# Patient Record
Sex: Male | Born: 1995 | Race: White | Hispanic: No | Marital: Married | Smoking: Former smoker
Health system: Southern US, Community
[De-identification: ages and names within clinical notes are randomized; demographics above are authoritative.]

## PROBLEM LIST (undated history)

## (undated) DIAGNOSIS — T7840XA Allergy, unspecified, initial encounter: Secondary | ICD-10-CM

## (undated) HISTORY — DX: Allergy, unspecified, initial encounter: T78.40XA

---

## 2013-02-14 ENCOUNTER — Emergency Department: Payer: Self-pay | Admitting: Unknown Physician Specialty

## 2017-07-05 ENCOUNTER — Encounter: Payer: Self-pay | Admitting: Primary Care

## 2017-07-05 ENCOUNTER — Ambulatory Visit (INDEPENDENT_AMBULATORY_CARE_PROVIDER_SITE_OTHER): Payer: BLUE CROSS/BLUE SHIELD | Admitting: Primary Care

## 2017-07-05 ENCOUNTER — Encounter (INDEPENDENT_AMBULATORY_CARE_PROVIDER_SITE_OTHER): Payer: Self-pay

## 2017-07-05 VITALS — BP 148/94 | HR 88 | Temp 97.8°F | Ht 68.75 in | Wt 227.1 lb

## 2017-07-05 DIAGNOSIS — E785 Hyperlipidemia, unspecified: Secondary | ICD-10-CM | POA: Diagnosis not present

## 2017-07-05 DIAGNOSIS — L918 Other hypertrophic disorders of the skin: Secondary | ICD-10-CM | POA: Diagnosis not present

## 2017-07-05 DIAGNOSIS — R030 Elevated blood-pressure reading, without diagnosis of hypertension: Secondary | ICD-10-CM | POA: Diagnosis not present

## 2017-07-05 LAB — COMPREHENSIVE METABOLIC PANEL
ALBUMIN: 5 g/dL (ref 3.5–5.2)
ALK PHOS: 57 U/L (ref 39–117)
ALT: 134 U/L — ABNORMAL HIGH (ref 0–53)
AST: 50 U/L — ABNORMAL HIGH (ref 0–37)
BUN: 13 mg/dL (ref 6–23)
CALCIUM: 10.3 mg/dL (ref 8.4–10.5)
CHLORIDE: 100 meq/L (ref 96–112)
CO2: 26 mEq/L (ref 19–32)
Creatinine, Ser: 0.94 mg/dL (ref 0.40–1.50)
GFR: 105.11 mL/min (ref >60.00)
Glucose, Bld: 80 mg/dL (ref 70–99)
POTASSIUM: 4.2 meq/L (ref 3.5–5.1)
Sodium: 137 mEq/L (ref 135–145)
TOTAL PROTEIN: 8 g/dL (ref 6.0–8.3)
Total Bilirubin: 0.4 mg/dL (ref 0.2–1.2)

## 2017-07-05 LAB — LIPID PANEL
CHOL/HDL RATIO: 5
CHOLESTEROL: 230 mg/dL — AB (ref 0–200)
HDL: 50.4 mg/dL (ref >39.00)
Triglycerides: 473 mg/dL — ABNORMAL HIGH (ref 0.0–149.0)

## 2017-07-05 LAB — HEMOGLOBIN A1C: HEMOGLOBIN A1C: 5.1 % (ref 4.6–6.5)

## 2017-07-05 LAB — LDL CHOLESTEROL, DIRECT: Direct LDL: 135 mg/dL

## 2017-07-05 NOTE — Assessment & Plan Note (Signed)
Located to right groin. Doesn't appear suspicious. Discussed that this can be removed in the office on another day. He will schedule.

## 2017-07-05 NOTE — Progress Notes (Signed)
Subjective:    Patient ID: Samuel Fields, male    DOB: Mar 23, 1996, 21 y.o.   MRN: [id number]  HPI  Samuel Fields is a 21 year old male who presents today to establish care and discuss the problems mentioned below. Will obtain old records.  1) Elevated Blood Pressure Reading: BP of 148/94 in the office today. Prior history of tobacco abuse, currently using e-cigarettes. He had a health screening at work one year ago and was told that he has hyperlipidemia and high blood pressure. Strong family history of heart attack in his father at age 23, who has had 2 additional heart attacks since then. Strong family history of hypertension in mother and father, diabetes in father. He does not check his blood pressure at home. He denies chest pain, dizziness, headaches, visual changes.   Diet currently consists of:  Breakfast: Skips, sometimes fast food or cereal Lunch: Sandwich, sometimes chips Dinner: [employer] food, grilled meat, steak, corn potatoes Snacks: Chips, crackers, hot pocket, pizza rolls. Desserts: Occasionally  Beverages: Soda, little water  Exercise: He does not exercise.    2) Nevus: Located to the right groin that has been present for the last several years. Over the last 2 months he's noticed intermittent swelling, irritation, and whitish drainage.   Review of Systems  Eyes: Negative for visual disturbance.  Respiratory: Negative for shortness of breath.   Cardiovascular: Negative for chest pain.  Skin:       Nevus   Neurological: Negative for dizziness and headaches.       Past Medical History:  Diagnosis Date  . Allergy      Social History   Social History  . Marital status: Single    Spouse name: N/A  . Number of children: N/A  . Years of education: N/A   Occupational History  . Not on file.   Social History Main Topics  . Smoking status: Former Games developermoker  . Smokeless tobacco: Former NeurosurgeonUser  . Alcohol use Yes  . Drug use: Unknown  . Sexual activity: Not on file    Other Topics Concern  . Not on file   Social History Narrative   Single.   No children.   Not working.    Enjoys spending time with friends.       No past surgical history on file.  Family History  Problem Relation Age of Onset  . Hypertension Mother   . Hypertension Father   . Diabetes Father   . Heart attack Father   . Skin cancer Maternal Grandmother   . Prostate cancer Maternal Grandfather     No Known Allergies  No current outpatient prescriptions on file prior to visit.   No current facility-administered medications on file prior to visit.     BP (!) 148/94   Pulse 88   Temp 97.8 F (36.6 C) (Oral)   Ht 5' 8.75" (1.746 m)   Wt 227 lb 1.9 oz (103 kg)   SpO2 97%   BMI 33.78 kg/m    Objective:   Physical Exam  Constitutional: He is oriented to person, place, and time. He appears well-nourished.  Neck: Neck supple.  Cardiovascular: Normal rate and regular rhythm.   Pulmonary/Chest: Effort normal and breath sounds normal. He has no wheezes. He has no rales.  Neurological: He is alert and oriented to person, place, and time.  Skin: Skin is warm and dry.  0.5 mc flat, protruding skin tag to right groin. No drainage, erythema. Brown colored.  Psychiatric: He has a normal mood and affect.          Assessment & Plan:

## 2017-07-05 NOTE — Assessment & Plan Note (Signed)
Above goal today, even on recheck. Will have him start monitoring BP at home. Discussed goal BP of 120-130/80's. Follow up in 3 months for re-evaluation, will allow him time to work on lifestyle changes.

## 2017-07-05 NOTE — Patient Instructions (Signed)
Complete lab work prior to leaving today. I will notify you of your results once received.   Check your blood pressure several times weekly, around the same time of day for the next several months. Ensure that you have rested for 30 minutes prior to checking your blood pressure. Record your readings.   Your blood pressure goal is 120-130/80's.  It's important to improve your diet by reducing consumption of fast food, fried food, processed snack foods, sugary drinks. Increase consumption of fresh vegetables and fruits, whole grains, water.  Ensure you are drinking 64 ounces of water daily.  Start exercising. You should be getting 150 minutes of moderate intensity exercise weekly.  Complete lab work prior to leaving today. I will notify you of your results once received.   Please schedule an appointment in 3 months for re-evaluation of your blood pressure. We can remove the skin tag at that time or anytime.  It was a pleasure to meet you today! Please don't hesitate to call me with any questions. Welcome to [facility]!    DASH Eating Plan DASH stands for "Dietary Approaches to Stop Hypertension." The DASH eating plan is a healthy eating plan that has been shown to reduce high blood pressure (hypertension). It may also reduce your risk for type 2 diabetes, heart disease, and stroke. The DASH eating plan may also help with weight loss. What are tips for following this plan? General guidelines  Avoid eating more than 2,300 mg (milligrams) of salt (sodium) a day. If you have hypertension, you may need to reduce your sodium intake to 1,500 mg a day.  Limit alcohol intake to no more than 1 drink a day for nonpregnant women and 2 drinks a day for men. One drink equals 12 oz of beer, 5 oz of wine, or 1 oz of hard liquor.  Work with your health care provider to maintain a healthy body weight or to lose weight. Ask what an ideal weight is for you.  Get at least 30 minutes of exercise that causes  your heart to beat faster (aerobic exercise) most days of the week. Activities may include walking, swimming, or biking.  Work with your health care provider or diet and nutrition specialist (dietitian) to adjust your eating plan to your individual calorie needs. Reading food labels  Check food labels for the amount of sodium per serving. Choose foods with less than 5 percent of the Daily Value of sodium. Generally, foods with less than 300 mg of sodium per serving fit into this eating plan.  To find whole grains, look for the word "whole" as the first word in the ingredient list. Shopping  Buy products labeled as "low-sodium" or "no salt added."  Buy fresh foods. Avoid canned foods and premade or frozen meals. Cooking  Avoid adding salt when cooking. Use salt-free seasonings or herbs instead of table salt or sea salt. Check with your health care provider or pharmacist before using salt substitutes.  Do not fry foods. Cook foods using healthy methods such as baking, boiling, grilling, and broiling instead.  Cook with heart-healthy oils, such as olive, canola, soybean, or sunflower oil. Meal planning   Eat a balanced diet that includes: ? 5 or more servings of fruits and vegetables each day. At each meal, try to fill half of your plate with fruits and vegetables. ? Up to 6-8 servings of whole grains each day. ? Less than 6 oz of lean meat, poultry, or fish each day. A 3-oz serving of  meat is about the same size as a deck of cards. One egg equals 1 oz. ? 2 servings of low-fat dairy each day. ? A serving of nuts, seeds, or beans 5 times each week. ? Heart-healthy fats. Healthy fats called Omega-3 fatty acids are found in foods such as flaxseeds and coldwater fish, like sardines, salmon, and mackerel.  Limit how much you eat of the following: ? Canned or prepackaged foods. ? Food that is high in trans fat, such as fried foods. ? Food that is high in saturated fat, such as fatty  meat. ? Sweets, desserts, sugary drinks, and other foods with added sugar. ? Full-fat dairy products.  Do not salt foods before eating.  Try to eat at least 2 vegetarian meals each week.  Eat more home-cooked food and less restaurant, buffet, and fast food.  When eating at a restaurant, ask that your food be prepared with less salt or no salt, if possible. What foods are recommended? The items listed may not be a complete list. Talk with your dietitian about what dietary choices are best for you. Grains Whole-grain or whole-wheat bread. Whole-grain or whole-wheat pasta. Brown rice. Orpah Cobb. Bulgur. Whole-grain and low-sodium cereals. Pita bread. Low-fat, low-sodium crackers. Whole-wheat flour tortillas. Vegetables Fresh or frozen vegetables (raw, steamed, roasted, or grilled). Low-sodium or reduced-sodium tomato and vegetable juice. Low-sodium or reduced-sodium tomato sauce and tomato paste. Low-sodium or reduced-sodium canned vegetables. Fruits All fresh, dried, or frozen fruit. Canned fruit in natural juice (without added sugar). Meat and other protein foods Skinless chicken or [place]. Ground chicken or [place]. Pork with fat trimmed off. Fish and seafood. Egg whites. Dried beans, peas, or lentils. Unsalted nuts, nut butters, and seeds. Unsalted canned beans. Lean cuts of beef with fat trimmed off. Low-sodium, lean deli meat. Dairy Low-fat (1%) or fat-free (skim) milk. Fat-free, low-fat, or reduced-fat cheeses. Nonfat, low-sodium ricotta or cottage cheese. Low-fat or nonfat yogurt. Low-fat, low-sodium cheese. Fats and oils Soft margarine without trans fats. Vegetable oil. Low-fat, reduced-fat, or light mayonnaise and salad dressings (reduced-sodium). Canola, safflower, olive, soybean, and sunflower oils. Avocado. Seasoning and other foods Herbs. Spices. Seasoning mixes without salt. Unsalted popcorn and pretzels. Fat-free sweets. What foods are not recommended? The items listed  may not be a complete list. Talk with your dietitian about what dietary choices are best for you. Grains Baked goods made with fat, such as croissants, muffins, or some breads. Dry pasta or rice meal packs. Vegetables Creamed or fried vegetables. Vegetables in a cheese sauce. Regular canned vegetables (not low-sodium or reduced-sodium). Regular canned tomato sauce and paste (not low-sodium or reduced-sodium). Regular tomato and vegetable juice (not low-sodium or reduced-sodium). Rosita Fire. Olives. Fruits Canned fruit in a light or heavy syrup. Fried fruit. Fruit in cream or butter sauce. Meat and other protein foods Fatty cuts of meat. Ribs. Fried meat. Tomasa Blase. Sausage. Bologna and other processed lunch meats. Salami. Fatback. Hotdogs. Bratwurst. Salted nuts and seeds. Canned beans with added salt. Canned or smoked fish. Whole eggs or egg yolks. Chicken or [place] with skin. Dairy Whole or 2% milk, cream, and half-and-half. Whole or full-fat cream cheese. Whole-fat or sweetened yogurt. Full-fat cheese. Nondairy creamers. Whipped toppings. Processed cheese and cheese spreads. Fats and oils Butter. Stick margarine. Lard. Shortening. Ghee. Bacon fat. Tropical oils, such as coconut, palm kernel, or palm oil. Seasoning and other foods Salted popcorn and pretzels. Onion salt, garlic salt, seasoned salt, table salt, and sea salt. Worcestershire sauce. Tartar sauce. Barbecue sauce. Teriyaki  sauce. Soy sauce, including reduced-sodium. Steak sauce. Canned and packaged gravies. Fish sauce. Oyster sauce. Cocktail sauce. Horseradish that you find on the shelf. Ketchup. Mustard. Meat flavorings and tenderizers. Bouillon cubes. Hot sauce and Tabasco sauce. Premade or packaged marinades. Premade or packaged taco seasonings. Relishes. Regular salad dressings. Where to find more information:  National Heart, Lung, and Blood Institute: PopSteam.is  American Heart Association: www.heart.org Summary  The DASH  eating plan is a healthy eating plan that has been shown to reduce high blood pressure (hypertension). It may also reduce your risk for type 2 diabetes, heart disease, and stroke.  With the DASH eating plan, you should limit salt (sodium) intake to 2,300 mg a day. If you have hypertension, you may need to reduce your sodium intake to 1,500 mg a day.  When on the DASH eating plan, aim to eat more fresh fruits and vegetables, whole grains, lean proteins, low-fat dairy, and heart-healthy fats.  Work with your health care provider or diet and nutrition specialist (dietitian) to adjust your eating plan to your individual calorie needs. This information is not intended to replace advice given to you by your health care provider. Make sure you discuss any questions you have with your health care provider. Document Released: 09/29/2011 Document Revised: 10/03/2016 Document Reviewed: 10/03/2016 Elsevier Interactive Patient Education  2017 [employer].

## 2017-07-05 NOTE — Assessment & Plan Note (Signed)
Strong family history of heart disease. He has a history of hyperlipidemia from a screening one year ago. Poor diet does not exercise. Discussed the importance of a healthy diet and regular exercise in order for weight loss, and to reduce the risk of other medical problems. Check lipids, A1C, continue to monitor blood pressure.

## 2017-07-06 ENCOUNTER — Encounter: Payer: Self-pay | Admitting: *Deleted

## 2017-07-11 ENCOUNTER — Telehealth: Payer: Self-pay | Admitting: Primary Care

## 2017-07-11 NOTE — Telephone Encounter (Signed)
Caller Name:Angel Jakes Relationship to Patient:mom  Best number:[phone] Pharmacy:  Reason for call:  Mom ( on dpr) is requesting call back about labs.  Thanks

## 2017-07-11 NOTE — Telephone Encounter (Signed)
Spoken and notified patient's mother of Kate's comments. Patient's mother verbalized understanding. 

## 2017-07-13 ENCOUNTER — Ambulatory Visit (INDEPENDENT_AMBULATORY_CARE_PROVIDER_SITE_OTHER): Payer: BLUE CROSS/BLUE SHIELD | Admitting: Primary Care

## 2017-07-13 VITALS — BP 144/94 | HR 97 | Temp 98.0°F | Ht 68.75 in | Wt 244.0 lb

## 2017-07-13 DIAGNOSIS — L918 Other hypertrophic disorders of the skin: Secondary | ICD-10-CM | POA: Diagnosis not present

## 2017-07-13 NOTE — Assessment & Plan Note (Signed)
Consent signed. Site prepped with betadine solution. Pain Ease applied for analgesia.  2 skin tags removed with 11 blade, right groin and right axilla. Site cleansed. Silver nitrate used to stop bleeding, bandage applied. Patient tolerated well.

## 2017-07-13 NOTE — Patient Instructions (Signed)
The sites may bleed, use band-aids and apply pressure to help stop bleeding.  Use the antibiotic ointment once daily for the next several days.  It was a pleasure to see you today!

## 2017-07-13 NOTE — Progress Notes (Signed)
   Subjective:    Patient ID: Samuel Fields, male    DOB: 04-09-1996, 21 y.o.   MRN: [id number]  HPI  Samuel Fields is a 21 year old male who presents today for skin tag removal. He has a skin tag to the right groin and right axilla that have been present for years. He's noticed the right groin skin tag becoming more irritative with redness and drainage over the last several months and would like it removed.   Review of Systems  Constitutional: Negative for fever.  Skin:       Skin tag       Past Medical History:  Diagnosis Date  . Allergy      Social History   Social History  . Marital status: Single    Spouse name: N/A  . Number of children: N/A  . Years of education: N/A   Occupational History  . Not on file.   Social History Main Topics  . Smoking status: Former Games developer  . Smokeless tobacco: Former Neurosurgeon  . Alcohol use Yes  . Drug use: Unknown  . Sexual activity: Not on file   Other Topics Concern  . Not on file   Social History Narrative   Single.   No children.   Not working.    Enjoys spending time with friends.       No past surgical history on file.  Family History  Problem Relation Age of Onset  . Hypertension Mother   . Hypertension Father   . Diabetes Father   . Heart attack Father   . Skin cancer Maternal Grandmother   . Prostate cancer Maternal Grandfather     No Known Allergies  No current outpatient prescriptions on file prior to visit.   No current facility-administered medications on file prior to visit.     BP (!) 144/94   Pulse 97   Temp 98 F (36.7 C) (Oral)   Ht 5' 8.75" (1.746 m)   Wt 244 lb (110.7 kg)   SpO2 97%   BMI 36.30 kg/m    Objective:   Physical Exam  Constitutional: He appears well-nourished.  Cardiovascular: Normal rate.   Pulmonary/Chest: Effort normal.  Skin: Skin is warm and dry.  0.5 cm skin tag to right axilla and 0.25 cm skin tag to right groin.           Assessment & Plan:

## 2017-10-02 ENCOUNTER — Ambulatory Visit: Payer: BLUE CROSS/BLUE SHIELD | Admitting: Primary Care

## 2017-10-04 ENCOUNTER — Ambulatory Visit: Payer: BLUE CROSS/BLUE SHIELD | Admitting: Primary Care

## 2018-03-06 ENCOUNTER — Ambulatory Visit: Payer: BLUE CROSS/BLUE SHIELD | Admitting: Primary Care

## 2018-03-06 DIAGNOSIS — Z0289 Encounter for other administrative examinations: Secondary | ICD-10-CM

## 2018-10-12 ENCOUNTER — Emergency Department
Admission: EM | Admit: 2018-10-12 | Discharge: 2018-10-12 | Disposition: A | Discharge status: Home / Self Care | Payer: Self-pay | Attending: Emergency Medicine | Admitting: Emergency Medicine

## 2018-10-12 ENCOUNTER — Encounter: Payer: Self-pay | Admitting: Emergency Medicine

## 2018-10-12 ENCOUNTER — Other Ambulatory Visit: Payer: Self-pay

## 2018-10-12 ENCOUNTER — Emergency Department: Payer: Self-pay

## 2018-10-12 DIAGNOSIS — M79672 Pain in left foot: Secondary | ICD-10-CM | POA: Insufficient documentation

## 2018-10-12 DIAGNOSIS — Z87891 Personal history of nicotine dependence: Secondary | ICD-10-CM | POA: Insufficient documentation

## 2018-10-12 MED ORDER — DICLOFENAC SODIUM 50 MG PO TBEC: 50.0000 mg | DELAYED_RELEASE_TABLET | Freq: Two times a day (BID) | ORAL | 0 refills | Status: AC

## 2018-10-12 NOTE — ED Triage Notes (Signed)
Here with intermittent left foot pain denies known injury, NAD.

## 2018-10-12 NOTE — ED Provider Notes (Signed)
[facility] Emergency Department Provider Note ____________________________________________  Time seen: 1128  I have reviewed the triage vital signs and the nursing notes.  HISTORY  Chief Complaint  Foot Pain  HPI Samuel Fields is a 22 y.o. male presents to the ED accompanied by his wife, for evaluation of ongoing left foot pain.  [facility] to [facility]iatry for the same complaint.  He has not been able to establish an appointment with podiatry secondary to the fact that he has no medical insurance at this time.  He denies any injury, trauma, accident, or contusion to the foot or ankle.  He would report that he has been diagnosed with plantar fasciitis, but he localizes pain to the dorsal lateral aspect of the foot.  He reports pain is worsened with deep flexion and extension of the toes.  He denies any swelling, ankle pain, skin temperature or color changes, or any foot spasms.  He is recently completed a course of prednisone with some benefit.  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  Past Medical History:  Diagnosis Date  . Allergy     Patient Active Problem List   Diagnosis Date Noted  . Hyperlipidemia 07/05/2017  . Elevated blood pressure reading 07/05/2017  . Skin tag 07/05/2017    History reviewed. No pertinent surgical history.  Prior to Admission medications   Medication Sig Start Date End Date Taking? Authorizing Provider  diclofenac (VOLTAREN) 50 MG EC tablet Take 1 tablet (50 mg total) by mouth 2 (two) times daily. 10/12/18 11/11/18  Kataya Guimont, Charlesetta IvoryJenise V Bacon, PA-C    Allergies Patient has no known allergies.  Family History  Problem Relation Age of Onset  . Hypertension Mother   . Hypertension Father   . Diabetes Father   . Heart attack Father   . Skin cancer Maternal Grandmother   . Prostate cancer Maternal Grandfather     Social History Social History   Tobacco Use  . Smoking  status: Former Games developermoker  . Smokeless tobacco: Former Engineer, waterUser  Substance Use Topics  . Alcohol use: Yes  . Drug use: Not on file    Review of Systems  Constitutional: Negative for fever. Cardiovascular: Negative for chest pain. Respiratory: Negative for shortness of breath. Musculoskeletal: Negative for back pain.  Left foot pain as above. Skin: Negative for rash. Neurological: Negative for headaches, focal weakness or numbness. ____________________________________________  PHYSICAL EXAM:  VITAL SIGNS: ED Triage Vitals  Enc Vitals Group     BP 10/12/18 1051 122/81     Pulse Rate 10/12/18 1051 93     Resp 10/12/18 1051 18     Temp 10/12/18 1051 97.9 F (36.6 C)     Temp Source 10/12/18 1051 Oral     SpO2 10/12/18 1051 96 %     Weight 10/12/18 1040 230 lb (104.3 kg)     Height 10/12/18 1040 5\' 9"  (1.753 m)     Head Circumference --      Peak Flow --      Pain Score 10/12/18 1040 0     Pain Loc --      Pain Edu? --      Excl. in GC? --     Constitutional: Alert and oriented. Well appearing and in no distress. Head: Normocephalic and atraumatic. Cardiovascular: Normal rate, regular rhythm. Normal distal pulses. Respiratory: Normal respiratory effort.  Musculoskeletal: left foot without deformity, effusion, or dislocation. noral ankle ROM.  Mildly tender to palp over the dorsolateral foot. Mild tenderness with lateral compression to the calcaneal heel. Nontender with normal range of motion in all extremities.  Neurologic:  Normal gait without ataxia. Normal speech and language. No gross focal neurologic deficits are appreciated. Skin:  Skin is warm, dry and intact. No rash noted. ____________________________________________   RADIOLOGY  Left Foot  Negative  I, Kipton Skillen, Charlesetta IvoryJenise V Bacon, personally viewed and evaluated these images (plain radiographs) as part of my medical decision making, as well as reviewing the written report by the  radiologist. ____________________________________________  PROCEDURES  Procedures ____________________________________________  INITIAL IMPRESSION / ASSESSMENT AND PLAN / ED COURSE  Patient with ED evaluation of probable foot tendinitis including some mild lateral tendinitis and plantar fasciitis.  Patient is advised to consider purchasing a rigid soled shoe for work.  He should consider following up with podiatry when his insurance is in effect.  He verbalized understanding and will be discharged with a prescription for diclofenac.  A work note is provided for 2 days as requested. ____________________________________________  FINAL CLINICAL IMPRESSION(S) / ED DIAGNOSES  Final diagnoses:  Foot pain, left      Lissa HoardMenshew, Pesach Frisch V Bacon, PA-C 10/12/18 1337    Minna AntisPaduchowski, Kevin, MD 10/12/18 1433

## 2018-10-12 NOTE — ED Notes (Signed)
X-ray at bedside

## 2018-10-12 NOTE — Discharge Instructions (Signed)
Your exam is concerning for foot tendinitis. Take the prescription med as directed. Consider buying a work shoe with a solid sole. Follow-up with podiatry for ongoing symptoms.

## 2018-10-29 ENCOUNTER — Encounter: Payer: Self-pay | Admitting: Primary Care

## 2018-10-29 ENCOUNTER — Ambulatory Visit (INDEPENDENT_AMBULATORY_CARE_PROVIDER_SITE_OTHER): Payer: BLUE CROSS/BLUE SHIELD | Admitting: Primary Care

## 2018-10-29 VITALS — BP 146/96 | HR 86 | Temp 97.6°F | Ht 69.0 in | Wt 233.5 lb

## 2018-10-29 DIAGNOSIS — G8929 Other chronic pain: Secondary | ICD-10-CM | POA: Insufficient documentation

## 2018-10-29 DIAGNOSIS — M79672 Pain in left foot: Secondary | ICD-10-CM | POA: Diagnosis not present

## 2018-10-29 NOTE — Progress Notes (Signed)
Subjective:    Patient ID: Samuel Fields, male    DOB: [birth year], 23 y.o.   MRN: [id number]  HPI  Samuel Fields is a 23 year old male who presents today with a chief complaint of foot pain.  He was evaluated at [facility] ED on 10/12/18 for a chief complaint of left dorsal foot pain. Pain is worse with flexion and extension of his toes. He was also evaluated at [facility] in August and November 2019 for same. He was diagnosed with plantar fasciitis and referred to podiatry. He had been treated with prednisone in the past with some benefit. From his visit at [facility] he was diagnosed with lateral tendinitis and plantar fasciitis. He was advised to purchase insoles for his shoes and follow up with podiatry as recommended. He was provided with a prescription for diclofenac.   Since both of his [facility] evaluations and [facility] ED evaluation he continues to experience pain which is intermittent. Most of his pain is located to the lateral side of his left foot. 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 He does wear Nike tennis shoes. Today he's needing a work excuse for his job. He's been out of work since December 15th and plans on returning to work on January 27th.  He did purchase insoles to his shoes, has also done some stretching. Overall he's feeling better as he's not been to work. He would like a referral to podiatry as he's not been contacted about this yet. He couldn't go any earlier as he didn't have insurance coverage.   Review of Systems  Musculoskeletal: Positive for arthralgias.  Skin: Negative for color change.  Neurological: Negative for numbness.       Past Medical History:  Diagnosis Date  . Allergy      Social History   Socioeconomic History  . Marital status: Single    Spouse name: Not on file  . Number of children: Not on file  . Years of education: Not on file  . Highest education level: Not on file    Occupational History  . Not on file  Social Needs  . Financial resource strain: Not on file  . Food insecurity:    Worry: Not on file    Inability: Not on file  . Transportation needs:    Medical: Not on file    Non-medical: Not on file  Tobacco Use  . Smoking status: Former Games developermoker  . Smokeless tobacco: Former Engineer, waterUser  Substance and Sexual Activity  . Alcohol use: Yes  . Drug use: Not on file  . Sexual activity: Not on file  Lifestyle  . Physical activity:    Days per week: Not on file    Minutes per session: Not on file  . Stress: Not on file  Relationships  . Social connections:    Talks on phone: Not on file    Gets together: Not on file    Attends religious service: Not on file    Active member of club or organization: Not on file    Attends meetings of clubs or organizations: Not on file    Relationship status: Not on file  . Intimate partner violence:    Fear of current or ex partner: Not on file    Emotionally abused: Not on file    Physically abused: Not on file    Forced sexual activity: Not on file  Other  Topics Concern  . Not on file  Social History Narrative   Single.   No children.   Not working.    Enjoys spending time with friends.    No past surgical history on file.  Family History  Problem Relation Age of Onset  . Hypertension Mother   . Hypertension Father   . Diabetes Father   . Heart attack Father   . Skin cancer Maternal Grandmother   . Prostate cancer Maternal Grandfather     No Known Allergies  Current Outpatient Medications on File Prior to Visit  Medication Sig Dispense Refill  . diclofenac (VOLTAREN) 50 MG EC tablet Take 1 tablet (50 mg total) by mouth 2 (two) times daily. 60 tablet 0   No current facility-administered medications on file prior to visit.     BP (!) 146/96   Pulse 86   Temp 97.6 F (36.4 C) (Oral)   Ht 5\' 9"  (1.753 m)   Wt 233 lb 8 oz (105.9 kg)   SpO2 97%   BMI 34.48 kg/m    Objective:   Physical  Exam  Constitutional: He appears well-nourished.  Cardiovascular: Normal rate.  Respiratory: Effort normal.  Musculoskeletal:     Left foot: Normal range of motion. No tenderness, bony tenderness, swelling or deformity.       Feet:  Skin: Skin is warm and dry. No erythema.           Assessment & Plan:

## 2018-10-29 NOTE — Assessment & Plan Note (Addendum)
Since August 2019. 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 Discussed stretching exercises, insole use. Referral placed to podiatry for further evaluation.  Will excuse him from work from December 15th 2019 through January 26th 2020. He will have his occupation send [place] paperwork. Visits from [facility] and [facility] ED reviewed.

## 2018-10-29 NOTE — Patient Instructions (Signed)
You will be contacted regarding your referral to podiatry.  Please let us know if you have not been contacted within one week.   Try the stretching exercises as discussed.  You may take the diclofenac medication as needed for pain.  It was a pleasure to see you today!

## 2018-11-05 ENCOUNTER — Telehealth: Payer: Self-pay

## 2018-11-05 NOTE — Telephone Encounter (Signed)
[facility] Night - Client Nonclinical Telephone Record [facility] Medical Call Center Client [facility] Night - Client Client Site  [facility] [place] - Night Physician Vernona Rieger - NP Contact Type Call Who Is Calling Patient / Member / Family / Caregiver Caller Name Laszlo Fornwalt Caller Phone Number [phone] Patient Name Samuel Fields Patient DOB 1995-12-03 Call Type Message Only Information Provided Reason for Call Request to Schedule Office Appointment Initial Comment Caller states sees Vernona Rieger who is making appt w/foot specialist; caller declined triage; Additional Comment Re referral to foot specialist; Call Closed By: Albin Fischer Transaction Date/Time: 11/03/2018 11:42:02 AM (ET)

## 2018-11-05 NOTE — Telephone Encounter (Signed)
Appt made and patient notified 

## 2018-11-06 ENCOUNTER — Telehealth: Payer: Self-pay | Admitting: Primary Care

## 2018-11-06 DIAGNOSIS — Z0279 Encounter for issue of other medical certificate: Secondary | ICD-10-CM

## 2018-11-06 NOTE — Telephone Encounter (Signed)
[facility] faxed fmla paperwork In [place] in box for review and signature

## 2018-11-06 NOTE — Telephone Encounter (Signed)
Completed and placed on [place] desk. 

## 2018-11-08 NOTE — Telephone Encounter (Signed)
Tried calling  Phone would not ring

## 2018-11-08 NOTE — Telephone Encounter (Signed)
Paperwork faxed Copy for pt Copy for scan Copy for billing 

## 2018-11-08 NOTE — Telephone Encounter (Signed)
Pt aware.

## 2018-11-12 ENCOUNTER — Ambulatory Visit: Payer: BLUE CROSS/BLUE SHIELD | Admitting: Podiatry

## 2018-11-14 ENCOUNTER — Telehealth: Payer: Self-pay | Admitting: Podiatry

## 2018-11-14 ENCOUNTER — Ambulatory Visit (INDEPENDENT_AMBULATORY_CARE_PROVIDER_SITE_OTHER): Payer: BLUE CROSS/BLUE SHIELD | Admitting: Podiatry

## 2018-11-14 ENCOUNTER — Encounter: Payer: Self-pay | Admitting: Podiatry

## 2018-11-14 ENCOUNTER — Ambulatory Visit (INDEPENDENT_AMBULATORY_CARE_PROVIDER_SITE_OTHER): Payer: BLUE CROSS/BLUE SHIELD

## 2018-11-14 VITALS — BP 137/98 | HR 82 | Resp 16 | Ht 70.0 in | Wt 235.0 lb

## 2018-11-14 DIAGNOSIS — M84375A Stress fracture, left foot, initial encounter for fracture: Secondary | ICD-10-CM

## 2018-11-14 DIAGNOSIS — M779 Enthesopathy, unspecified: Secondary | ICD-10-CM

## 2018-11-14 DIAGNOSIS — M778 Other enthesopathies, not elsewhere classified: Secondary | ICD-10-CM

## 2018-11-14 MED ORDER — DICLOFENAC SODIUM 75 MG PO TBEC: 75.0000 mg | DELAYED_RELEASE_TABLET | Freq: Two times a day (BID) | ORAL | 1 refills | Status: DC

## 2018-11-14 NOTE — Telephone Encounter (Signed)
Patient has not received his medication

## 2018-11-14 NOTE — Telephone Encounter (Signed)
Left message informing pt the voltaren had been sent to the [id number] and confirmed received by pharmacy 1:22pm.

## 2018-11-14 NOTE — Progress Notes (Signed)
   Subjective:    Patient ID: Samuel Fields, male    DOB: 12-02-1995, 23 y.o.   MRN: [id number]  HPI    Review of Systems  Musculoskeletal: Positive for arthralgias and myalgias.  All other systems reviewed and are negative.      Objective:   Physical Exam        Assessment & Plan:

## 2018-11-20 ENCOUNTER — Encounter: Payer: Self-pay | Admitting: Podiatry

## 2018-11-20 ENCOUNTER — Telehealth: Payer: Self-pay | Admitting: Podiatry

## 2018-11-20 ENCOUNTER — Encounter: Payer: Self-pay | Admitting: *Deleted

## 2018-11-20 NOTE — Telephone Encounter (Addendum)
I called pt and asked if he wanted his LOA extended until he was reevaluated 12/17/2018. Pt states he hoped he could get a boot and go back to work sooner. 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 I told pt I would send a message to the [place] office and he could be fitted for a cam boot there today or tomorrow and also pick up the LOA note. I told pt he would need to be in the boot at least 4 weeks about the time of the reevaluation to see a change toward healing.

## 2018-11-20 NOTE — Telephone Encounter (Signed)
I saw Dr. Logan Bores last week and he diagnosed me with a stress fracture. I was wondering if he can get me fitted in a boot and extend my LOA. I would appreciate a call back today at [phone].

## 2018-11-25 NOTE — Progress Notes (Signed)
   HPI: 23 year old male presenting today as a new patient with a chief complaint of intermittent sharp pain to the lateral left foot that began about 4 months ago. Standing and applying pressure to the foot increases the pain. He has been wearing an ankle brace, taking Ibuprofen and applying [facility] for treatment. Patient is here for further evaluation and treatment.   Past Medical History:  Diagnosis Date  . Allergy      Physical Exam: General: The patient is alert and oriented x3 in no acute distress.  Dermatology: Skin is warm, dry and supple bilateral lower extremities. Negative for open lesions or macerations.  Vascular: Palpable pedal pulses bilaterally. No edema or erythema noted. Capillary refill within normal limits.  Neurological: Epicritic and protective threshold grossly intact bilaterally.   Musculoskeletal Exam: Pain with palpation to the fourth metatarsal of the left foot. Range of motion within normal limits to all pedal and ankle joints bilateral. Muscle strength 5/5 in all groups bilateral.   Radiographic Exam:  Normal osseous mineralization. Joint spaces preserved. No fracture/dislocation/boney destruction.    Assessment: 1. Possible stress fracture of the fourth metatarsal left based on clinical exam   Plan of Care:  1. Patient evaluated. X-Rays reviewed.  2. Injection of 0.5 mLs Celestone Soluspan injected into the 4th metatarsal of the left foot.  3. Compression anklet dispensed.  4. Prescription for Diclofenac 75 mg BID provided to patient.  5. Recommended good shoe gear.  6. Patient needs to get back on 11/19/2018.  7. Return to clinic in 4 weeks.   Works at [employer].      Felecia Shelling, DPM [facility]  Dr. Felecia Shelling, DPM    [address] [place], [place] [address]                Office [phone]  Fax [phone]

## 2018-12-17 ENCOUNTER — Encounter: Payer: BLUE CROSS/BLUE SHIELD | Admitting: Podiatry

## 2018-12-25 NOTE — Progress Notes (Signed)
This encounter was created in error - please disregard.

## 2019-05-30 ENCOUNTER — Other Ambulatory Visit: Payer: Self-pay

## 2019-05-30 DIAGNOSIS — Z20822 Contact with and (suspected) exposure to covid-19: Secondary | ICD-10-CM

## 2019-05-31 LAB — NOVEL CORONAVIRUS, NAA: SARS-CoV-2, NAA: NOT DETECTED

## 2019-06-18 ENCOUNTER — Other Ambulatory Visit: Payer: Self-pay

## 2019-06-18 ENCOUNTER — Telehealth: Payer: Self-pay | Admitting: Primary Care

## 2019-06-18 DIAGNOSIS — Z20822 Contact with and (suspected) exposure to covid-19: Secondary | ICD-10-CM

## 2019-06-18 NOTE — Telephone Encounter (Signed)
Patient stated that he is being tested today for covid and needs a note stating he must be out of work to quarantine and wait on test results.  Are you able to write a note for him?   C/B # [phone]

## 2019-06-18 NOTE — Telephone Encounter (Signed)
Noted, please set him up for a virtual visit to discuss his symptoms and the work note. Thanks.

## 2019-06-19 ENCOUNTER — Encounter: Payer: Self-pay | Admitting: Primary Care

## 2019-06-19 ENCOUNTER — Ambulatory Visit (INDEPENDENT_AMBULATORY_CARE_PROVIDER_SITE_OTHER): Payer: Self-pay | Admitting: Primary Care

## 2019-06-19 DIAGNOSIS — J069 Acute upper respiratory infection, unspecified: Secondary | ICD-10-CM | POA: Insufficient documentation

## 2019-06-19 DIAGNOSIS — B9789 Other viral agents as the cause of diseases classified elsewhere: Secondary | ICD-10-CM

## 2019-06-19 LAB — NOVEL CORONAVIRUS, NAA: SARS-CoV-2, NAA: NOT DETECTED

## 2019-06-19 NOTE — Telephone Encounter (Signed)
Attempted to reach patient to get this scheduled. Message states he is not accepting calls at this time.

## 2019-06-19 NOTE — Telephone Encounter (Signed)
Pt states he already got a note and doesn't need to schedule appointment.

## 2019-06-19 NOTE — Telephone Encounter (Signed)
Patient evaluated, work note provided.

## 2019-06-19 NOTE — Assessment & Plan Note (Signed)
Recent symptoms that began last week, now feeling much better. Negative Covid test with results today.  Appears well, not sickly. Work note provided to return to work.

## 2019-06-19 NOTE — Patient Instructions (Signed)
You may return to work on August 27th as scheduled.  It was a pleasure to see you today!

## 2019-06-19 NOTE — Progress Notes (Signed)
Subjective:    Patient ID: Samuel Fields, male    DOB: 1996/10/15, 23 y.o.   MRN: [id number]  HPI  Virtual Visit via Video Note  I connected with Lind Covert on 06/19/19 at  3:40 PM EDT by a video enabled telemedicine application and verified that I am speaking with the correct person using two identifiers.  Location: Patient: Home Provider: Office   I discussed the limitations of evaluation and management by telemedicine and the availability of in person appointments. The patient expressed understanding and agreed to proceed.  History of Present Illness:  Mr. Deiss is a 23 year old male who presents today requesting work note.  His fiance tested positive for Covid-19 last week, he started feeling symptoms included fatigue, sore throat, cough, feverish that began about 5 days ago. He called out of work this week on Monday,Tuesday, and Wednesday. He was tested for Covid-19 yesterday and was notified today via my chart that he was negative. He started feeling better today. Denies fevers. His fiance is feeling better as well.    Observations/Objective:  Alert and oriented. Appears well, not sickly. No distress. Speaking in complete sentences.   Assessment and Plan:  Suspect other viral URI, negative for Covid. Agree to excuse him from work during the days he was home, especially given his potential risk for Covid given his fiances positive test.  He appears well, work note provided.  Follow Up Instructions:  You may return to work on August 27th as scheduled.  It was a pleasure to see you today!    I discussed the assessment and treatment plan with the patient. The patient was provided an opportunity to ask questions and all were answered. The patient agreed with the plan and demonstrated an understanding of the instructions.   The patient was advised to call back or seek an in-person evaluation if the symptoms worsen or if the condition fails to improve as anticipated.    Pleas Koch, NP    Review of Systems  Constitutional: Negative for fatigue and fever.  HENT: Negative for congestion and sore throat.   Respiratory: Negative for cough.        Past Medical History:  Diagnosis Date  . Allergy      Social History   Socioeconomic History  . Marital status: Single    Spouse name: Not on file  . Number of children: Not on file  . Years of education: Not on file  . Highest education level: Not on file  Occupational History  . Not on file  Social Needs  . Financial resource strain: Not on file  . Food insecurity    Worry: Not on file    Inability: Not on file  . Transportation needs    Medical: Not on file    Non-medical: Not on file  Tobacco Use  . Smoking status: Former Research scientist (life sciences)  . Smokeless tobacco: Former Network engineer and Sexual Activity  . Alcohol use: Yes  . Drug use: Not on file  . Sexual activity: Not on file  Lifestyle  . Physical activity    Days per week: Not on file    Minutes per session: Not on file  . Stress: Not on file  Relationships  . Social Herbalist on phone: Not on file    Gets together: Not on file    Attends religious service: Not on file    Active member of club or organization: Not on  file    Attends meetings of clubs or organizations: Not on file    Relationship status: Not on file  . Intimate partner violence    Fear of current or ex partner: Not on file    Emotionally abused: Not on file    Physically abused: Not on file    Forced sexual activity: Not on file  Other Topics Concern  . Not on file  Social History Narrative   Single.   No children.   Not working.    Enjoys spending time with friends.    No past surgical history on file.  Family History  Problem Relation Age of Onset  . Hypertension Mother   . Hypertension Father   . Diabetes Father   . Heart attack Father   . Skin cancer Maternal Grandmother   . Prostate cancer Maternal Grandfather     No Known  Allergies  Current Outpatient Medications on File Prior to Visit  Medication Sig Dispense Refill  . diclofenac (VOLTAREN) 75 MG EC tablet Take 1 tablet (75 mg total) by mouth 2 (two) times daily. 60 tablet 1   No current facility-administered medications on file prior to visit.     There were no vitals taken for this visit.   Objective:   Physical Exam  Constitutional: He is oriented to person, place, and time. He appears well-nourished. He does not have a sickly appearance. He does not appear ill.  Respiratory: Effort normal. No respiratory distress.  Neurological: He is alert and oriented to person, place, and time.  Psychiatric: He has a normal mood and affect.           Assessment & Plan:

## 2019-12-23 ENCOUNTER — Ambulatory Visit: Payer: Self-pay | Attending: Internal Medicine

## 2019-12-23 DIAGNOSIS — Z20822 Contact with and (suspected) exposure to covid-19: Secondary | ICD-10-CM | POA: Insufficient documentation

## 2019-12-24 LAB — NOVEL CORONAVIRUS, NAA: SARS-CoV-2, NAA: NOT DETECTED

## 2020-05-11 IMAGING — DX DG FOOT COMPLETE 3+V*L*
3 series · 3 of 3 positions shown · non-contrast
Comparison: Plain films left foot 02/20/2017.

CLINICAL DATA: Chronic intermittent left foot pain. No known
injury.

EXAM:
LEFT FOOT - COMPLETE 3+ VIEW

[foot ap]
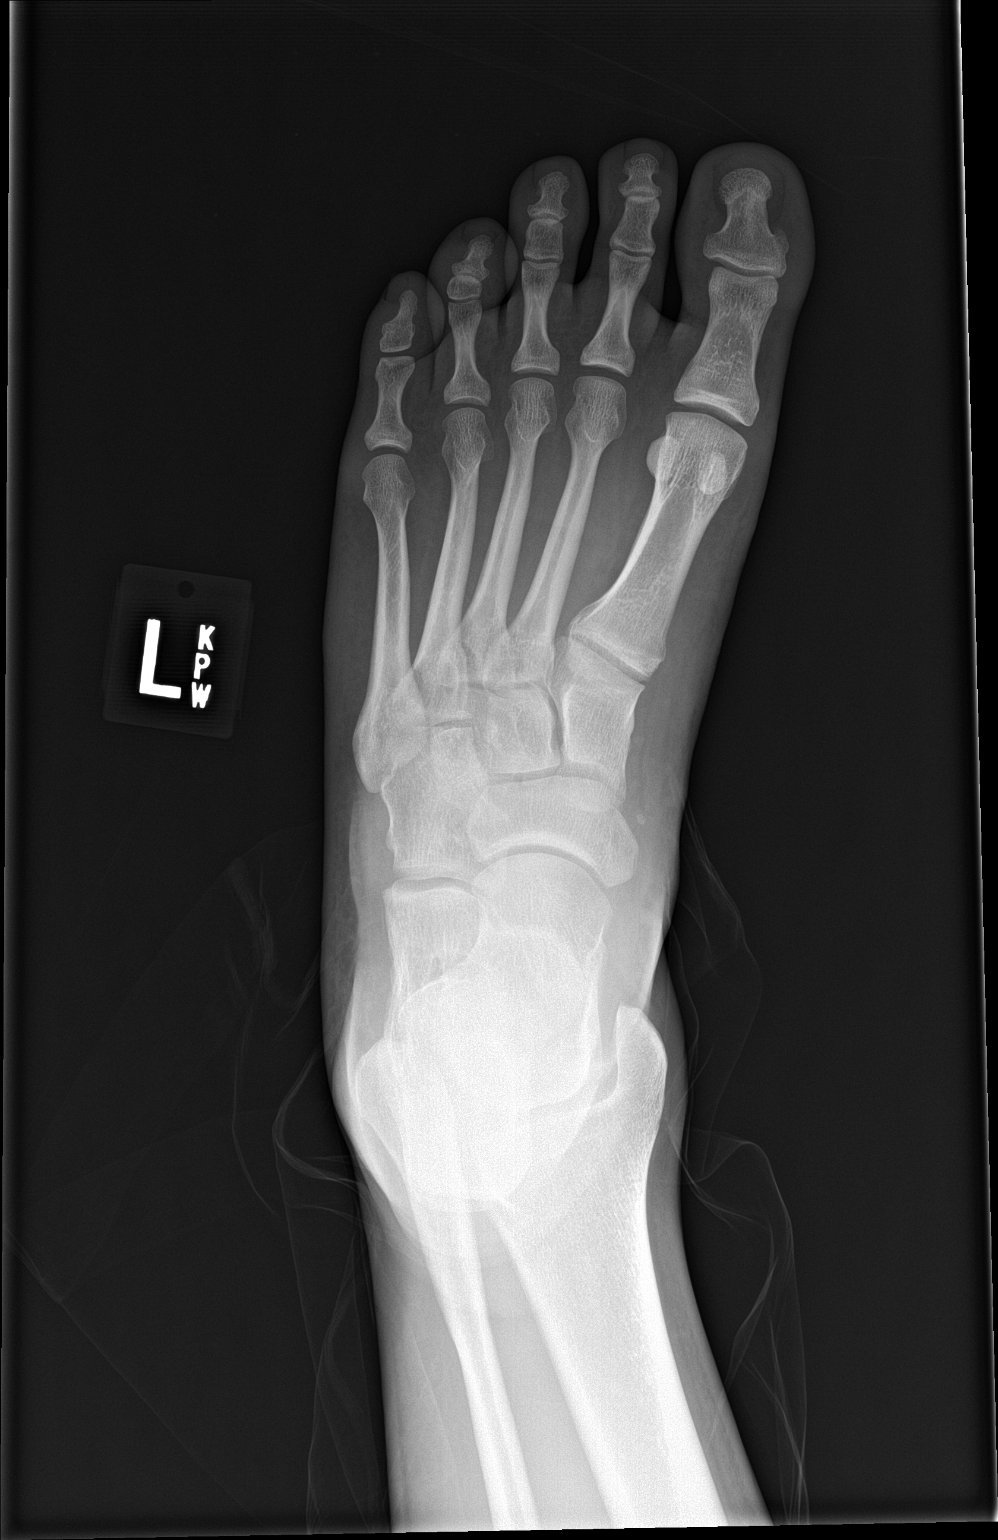

[foot obl]
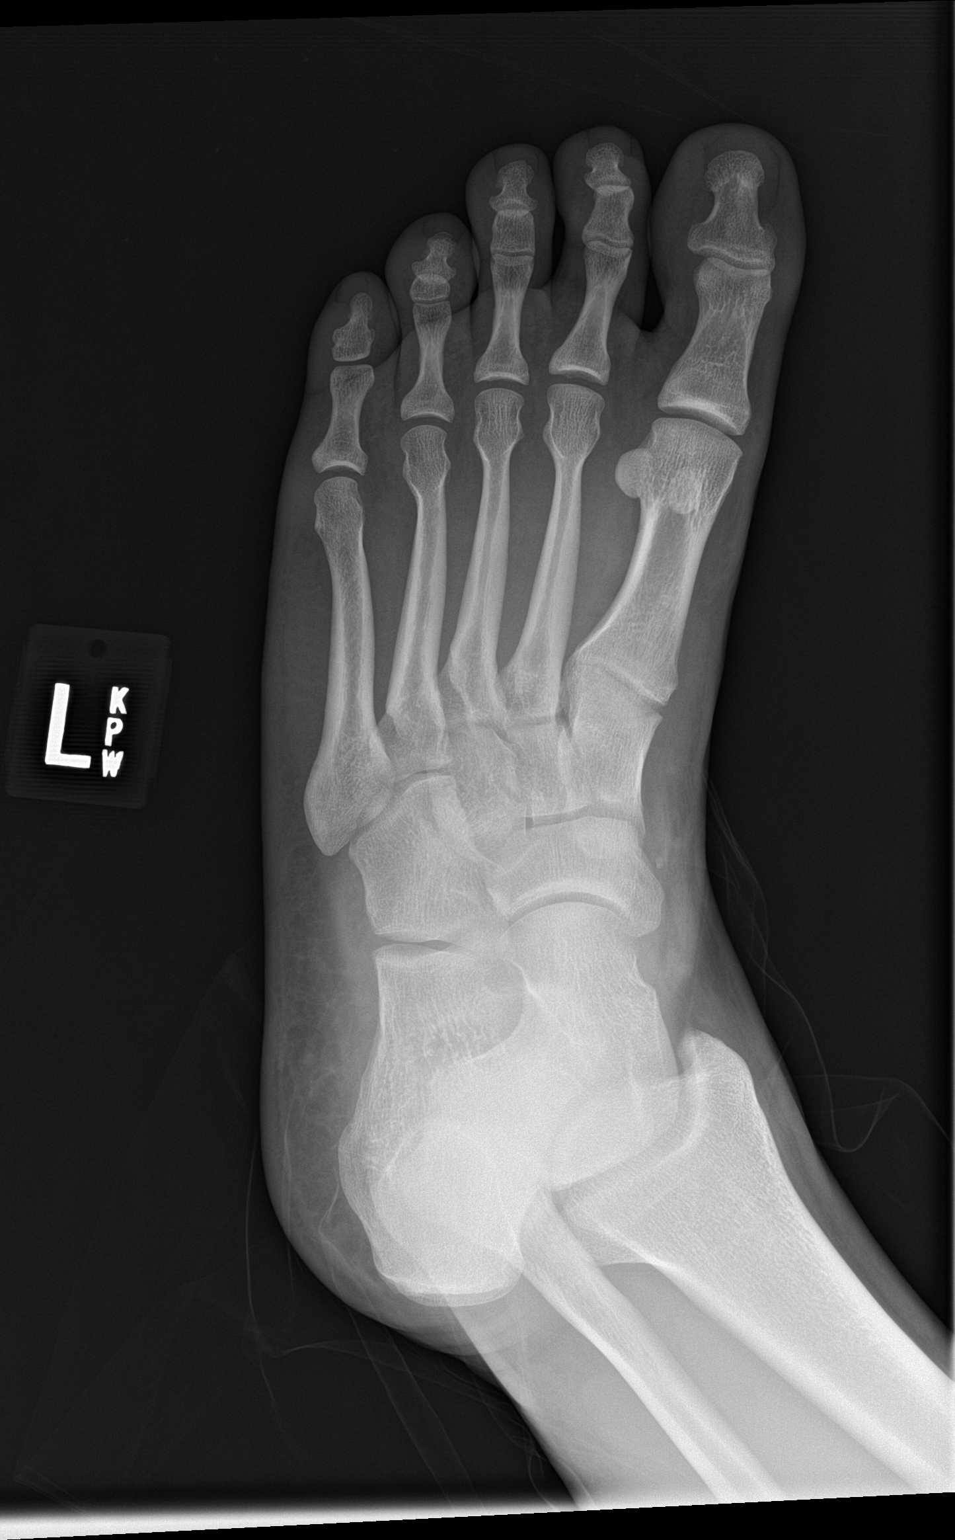

[foot lat]
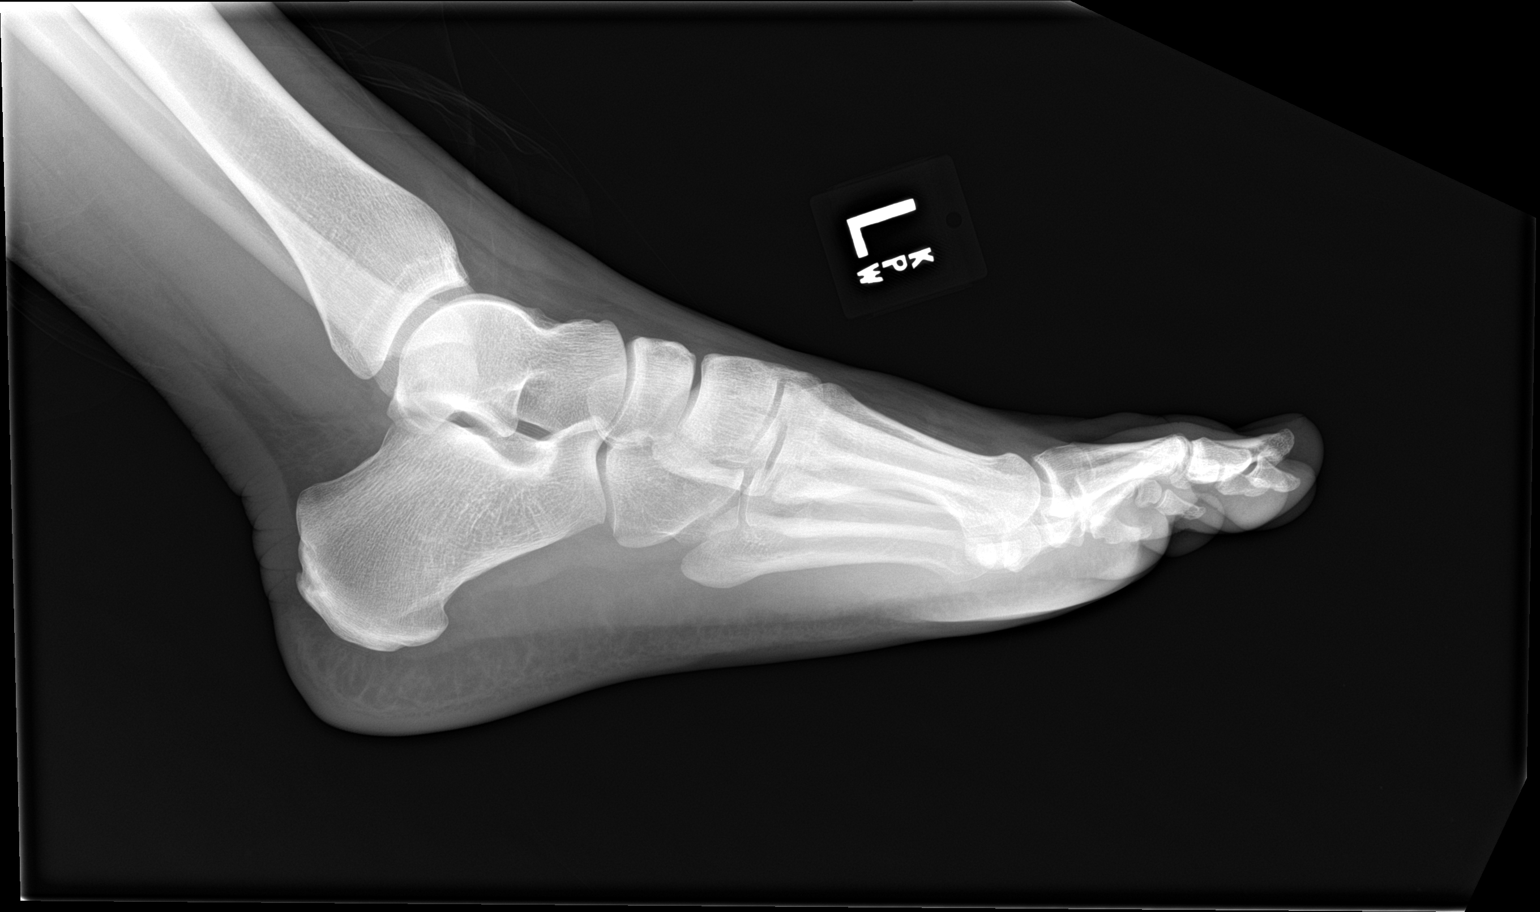

[3 of 3 positions shown; findings below may reference images not displayed]

FINDINGS: There is no evidence of fracture or dislocation. There is no
evidence of arthropathy or other focal bone abnormality. Soft
tissues are unremarkable.
IMPRESSION: Normal exam.

## 2020-08-12 ENCOUNTER — Other Ambulatory Visit: Payer: Self-pay

## 2020-08-12 ENCOUNTER — Emergency Department: Payer: Self-pay

## 2020-08-12 ENCOUNTER — Emergency Department
Admission: EM | Admit: 2020-08-12 | Discharge: 2020-08-12 | Disposition: A | Discharge status: Home / Self Care | Payer: Self-pay | Attending: Emergency Medicine | Admitting: Emergency Medicine

## 2020-08-12 ENCOUNTER — Encounter: Payer: Self-pay | Admitting: Emergency Medicine

## 2020-08-12 DIAGNOSIS — M79671 Pain in right foot: Secondary | ICD-10-CM

## 2020-08-12 DIAGNOSIS — Z87891 Personal history of nicotine dependence: Secondary | ICD-10-CM | POA: Insufficient documentation

## 2020-08-12 MED ORDER — DICLOFENAC SODIUM 75 MG PO TBEC: 75.0000 mg | DELAYED_RELEASE_TABLET | Freq: Two times a day (BID) | ORAL | 1 refills | Status: DC

## 2020-08-12 NOTE — ED Provider Notes (Signed)
[facility] Emergency Department Provider Note ____________________________________________  Time seen: 1507  I have reviewed the triage vital signs and the nursing notes.  HISTORY  Chief Complaint  Foot Pain   HPI Samuel Fields is a 24 y.o. male presents himself to the ED for evaluation of right foot pain.   Patient denies any recent injury or trauma to the foot.  He just reportedly feels as if something is wrong with the foot.  He does have a history of chronic left foot pain, previously evaluated in the ED about a year ago.  He was ultimately diagnosed with a stress fracture by podiatry for the left foot.  He believes the same presentation is here for this current right foot.  Past Medical History:  Diagnosis Date  . Allergy     Patient Active Problem List   Diagnosis Date Noted  . Viral URI with cough 06/19/2019  . Chronic pain in left foot 10/29/2018  . Hyperlipidemia 07/05/2017  . Elevated blood pressure reading 07/05/2017  . Skin tag 07/05/2017    History reviewed. No pertinent surgical history.  Prior to Admission medications   Medication Sig Start Date End Date Taking? Authorizing Provider  diclofenac (VOLTAREN) 75 MG EC tablet Take 1 tablet (75 mg total) by mouth 2 (two) times daily. 08/12/20   Samuel Fields, Samuel Ivory, PA-C    Allergies Patient has no known allergies.  Family History  Problem Relation Age of Onset  . Hypertension Mother   . Hypertension Father   . Diabetes Father   . Heart attack Father   . Skin cancer Maternal Grandmother   . Prostate cancer Maternal Grandfather     Social History Social History   Tobacco Use  . Smoking status: Former Games developer  . Smokeless tobacco: Former Clinical biochemist  . Vaping Use: Every day  Substance Use Topics  . Alcohol use: Yes  . Drug use: Not on file    Review of Systems  Constitutional: Negative for fever. Cardiovascular: Negative for chest pain. Respiratory: Negative for  shortness of breath. Musculoskeletal: Negative for back pain.  Right foot pain as above. Skin: Negative for rash. Neurological: Negative for headaches, focal weakness or numbness. ____________________________________________  PHYSICAL EXAM:  VITAL SIGNS: ED Triage Vitals  Enc Vitals Group     BP 08/12/20 1347 130/73     Pulse Rate 08/12/20 1347 98     Resp 08/12/20 1347 20     Temp 08/12/20 1347 98 F (36.7 C)     Temp Source 08/12/20 1347 Oral     SpO2 08/12/20 1347 97 %     Weight 08/12/20 1341 240 lb (108.9 kg)     Height 08/12/20 1341 5\' 10"  (1.778 m)     Head Circumference --      Peak Flow --      Pain Score 08/12/20 1341 8     Pain Loc --      Pain Edu? --      Excl. in GC? --     Constitutional: Alert and oriented. Well appearing and in no distress. Head: Normocephalic and atraumatic. Eyes: Conjunctivae are normal. Normal extraocular movements Cardiovascular: Normal rate, regular rhythm. Normal distal pulses and cap refill. Respiratory: Normal respiratory effort.  Musculoskeletal: Nontender with normal range of motion in all extremities.  Right foot without any obvious deformity, dislocation, or joint effusion.  Patient is tender to palpation to the dorsal foot at the fourth metatarsal.  Ankle exam is  benign with normal range of motion.  Negative anterior/posterior drawer sign.  No calf or Achilles tenderness is elicited. Neurologic:  Normal gait without ataxia. Normal speech and language. No gross focal neurologic deficits are appreciated. Skin:  Skin is warm, dry and intact. No rash noted.  No clubbing, cyanosis, or edema appreciated. Psychiatric: Mood and affect are normal. Patient exhibits appropriate insight and judgment. ____________________________________________   RADIOLOGY  DG Right Foot  Negative ____________________________________________  PROCEDURES  Post-Op Shoe  Procedures ____________________________________________  INITIAL IMPRESSION /  ASSESSMENT AND PLAN / ED COURSE  DDX: tendinitis, stress fracture, dislocation, sprain, OA  Patient with ED evaluation of a 3-day complaint of dorsal lateral right foot pain.  He denies any preceding injury trauma or fall.  His exam is negative for any acute findings, joint effusion, or inflammatory arthritis.  X-ray images reviewed by me, is also negative for any acute findings.  Patient will be placed in a postop shoe, and referred to podiatry for ongoing symptom management.  Samuel Fields was evaluated in Emergency Department on 08/12/2020 for the symptoms described in the history of present illness. He was evaluated in the context of the global COVID-19 pandemic, which necessitated consideration that the patient might be at risk for infection with the SARS-CoV-2 virus that causes COVID-19. Institutional protocols and algorithms that pertain to the evaluation of patients at risk for COVID-19 are in a state of rapid change based on information released by regulatory bodies including the CDC and federal and state organizations. These policies and algorithms were followed during the patient's care in the ED. ____________________________________________  FINAL CLINICAL IMPRESSION(S) / ED DIAGNOSES  Final diagnoses:  Foot pain, right      Lissa Hoard, PA-C 08/12/20 1613    Jene Every, MD 08/12/20 [phone]

## 2020-08-12 NOTE — Discharge Instructions (Signed)
Your exam and XR are negative for any fracture or dislocation at this time. Take the prescription med as directed. Apply ice to reduce symptoms. Follow-up with Dr. Logan Bores for ongoing symptoms. Return to the ED as needed.

## 2020-08-12 NOTE — ED Triage Notes (Signed)
Pt reports his right foot feels like something is wrong with it and has felt this way the last few days. Pt denies injuries

## 2020-08-12 NOTE — ED Notes (Signed)
Assessment completed by provider prior to d/c. Post op shoe placed on pt right foot prior to d/c

## 2020-11-27 ENCOUNTER — Encounter: Payer: Self-pay | Admitting: Primary Care

## 2020-11-27 ENCOUNTER — Telehealth: Payer: Self-pay | Admitting: Family Medicine

## 2020-11-27 ENCOUNTER — Telehealth (INDEPENDENT_AMBULATORY_CARE_PROVIDER_SITE_OTHER): Payer: Self-pay | Admitting: Primary Care

## 2020-11-27 ENCOUNTER — Encounter: Payer: Self-pay | Admitting: Family Medicine

## 2020-11-27 VITALS — Ht 70.0 in | Wt 250.0 lb

## 2020-11-27 DIAGNOSIS — Z20822 Contact with and (suspected) exposure to covid-19: Secondary | ICD-10-CM | POA: Insufficient documentation

## 2020-11-27 MED ORDER — FLUTICASONE PROPIONATE 50 MCG/ACT NA SUSP: 1.0000 | Freq: Two times a day (BID) | NASAL | 0 refills | Status: DC

## 2020-11-27 MED ORDER — CETIRIZINE HCL 10 MG PO TABS: 10.0000 mg | ORAL_TABLET | Freq: Every day | ORAL | 0 refills | Status: DC

## 2020-11-27 MED ORDER — BENZONATATE 200 MG PO CAPS: 200.0000 mg | ORAL_CAPSULE | Freq: Three times a day (TID) | ORAL | 0 refills | Status: DC | PRN

## 2020-11-27 NOTE — Progress Notes (Signed)
Subjective:    Patient ID: Samuel Fields, male    DOB: 04-Dec-1995, 25 y.o.   MRN: [id number]  HPI  Virtual Visit via Video Note  I connected with Samuel Fields on 11/27/20 at  3:40 PM EST by a video enabled telemedicine application and verified that I am speaking with the correct person using two identifiers.  Location: Patient: Home Provider: Office Participants: Patient and myself   I discussed the limitations of evaluation and management by telemedicine and the availability of in person appointments. The patient expressed understanding and agreed to proceed.  We attempted to connect via video but could not get his camera to enable. We had to conduct our visit via phone which lasted  11 min and 48 sec.  History of Present Illness:  Mr. Samuel Fields is a 25 year old male who presents today with a chief complaint of nasal congestion.   Symptoms began six days ago with sore throat, nasal congestion, rhinorrhea, left eye puffiness to the "corner", mild cough. This morning he found his left eye matted shut with yellow crust. Today his sore throat has improved, mostly now he's experiencing nasal congestion.  He's taken Mucinex and Tylenol without improvement. He's tried saline nasal spray without improvement. He denies fevers. His wife tested positive for Covid one week ago, she still is experiencing symptoms. He has not been tested for Covid-19.  He has not been vaccinated.  He has been quarantined since his wife's positive Covid result, he returned to work 3 days ago and has been wearing a mask.   Observations/Objective:  Alert and oriented. No distress. Speaking in complete sentences. No cough.   Assessment and Plan:  Viral appearing symptoms x6 days, feeling slightly better today. Highly suspicious for Covid given his wife's positive test a week prior. Discussed quarantine recommendations per the CDC.  I also recommended that he tested for Covid, he currently declines as he cannot  miss anymore work. Strongly advised he wear a mask when in public.  We will treat symptoms with Zyrtec 10 mg daily, Flonase twice daily, Tessalon Perles as needed.  He will update if anything changes.  Follow Up Instructions:  You may take Benzonatate capsules for cough. Take 1 capsule by mouth three times daily as needed for cough.  Start Zyrtec for runny nose, watery/itchy eyes.  Take this at bedtime.  Nasal Congestion/Ear Pressure/Sinus Pressure: Try using Flonase (fluticasone) nasal spray. Instill 1 spray in each nostril twice daily.   It was a pleasure to see you today! Mayra Reel, NP-C    I discussed the assessment and treatment plan with the patient. The patient was provided an opportunity to ask questions and all were answered. The patient agreed with the plan and demonstrated an understanding of the instructions.   The patient was advised to call back or seek an in-person evaluation if the symptoms worsen or if the condition fails to improve as anticipated.    Doreene Nest, NP    Review of Systems  Constitutional: Negative for chills, fatigue and fever.  HENT: Positive for congestion and sinus pressure. Negative for sore throat.   Eyes: Positive for itching.  Respiratory: Positive for cough.   Cardiovascular: Negative for chest pain.        Past Medical History:  Diagnosis Date  . Allergy      Social History   Socioeconomic History  . Marital status: Single    Spouse name: Not on file  . Number of children:  Not on file  . Years of education: Not on file  . Highest education level: Not on file  Occupational History  . Not on file  Tobacco Use  . Smoking status: Former Games developer  . Smokeless tobacco: Former Clinical biochemist  . Vaping Use: Every day  Substance and Sexual Activity  . Alcohol use: Yes  . Drug use: Not on file  . Sexual activity: Not on file  Other Topics Concern  . Not on file  Social History Narrative   Single.   No children.    Not working.    Enjoys spending time with friends.   Social Determinants of Health   Financial Resource Strain: Not on file  Food Insecurity: Not on file  Transportation Needs: Not on file  Physical Activity: Not on file  Stress: Not on file  Social Connections: Not on file  Intimate Partner Violence: Not on file    History reviewed. No pertinent surgical history.  Family History  Problem Relation Age of Onset  . Hypertension Mother   . Hypertension Father   . Diabetes Father   . Heart attack Father   . Skin cancer Maternal Grandmother   . Prostate cancer Maternal Grandfather     No Known Allergies  Current Outpatient Medications on File Prior to Visit  Medication Sig Dispense Refill  . diclofenac (VOLTAREN) 75 MG EC tablet Take 1 tablet (75 mg total) by mouth 2 (two) times daily. (Patient not taking: Reported on 11/27/2020) 30 tablet 1   No current facility-administered medications on file prior to visit.    Ht 5\' 10"  (1.778 m)   Wt 250 lb (113.4 kg)   BMI 35.87 kg/m    Objective:   Physical Exam Constitutional:      General: He is not in acute distress. Pulmonary:     Effort: Pulmonary effort is normal.     Comments: No cough during exam Neurological:     Mental Status: He is alert and oriented to person, place, and time.            Assessment & Plan:

## 2020-11-27 NOTE — Assessment & Plan Note (Signed)
Viral appearing symptoms x6 days, feeling slightly better today. Highly suspicious for Covid given his wife's positive test a week prior. Discussed quarantine recommendations per the CDC.  I also recommended that he tested for Covid, he currently declines as he cannot miss anymore work. Strongly advised he wear a mask when in public.  We will treat symptoms with Zyrtec 10 mg daily, Flonase twice daily, Tessalon Perles as needed.  He will update if anything changes.

## 2020-11-27 NOTE — Progress Notes (Signed)
Attempted to contact patient at the time of his appointment via link sent to both phone number in the appointment notes and phone number in chart and also called patient.  Left message for patient to connect to the appointment link or to contact primary care office if needed to reschedule, cancel order for assistance.  Did advise that I would wait in the virtual space until about 10 to 12 minutes past his appointment time in case he still needed the appointment and wished to logon, but that after that I would need to move on to the next appointment.  Advised if he no longer needed the appointment, to contact his primary care office to reschedule or cancel.

## 2020-11-27 NOTE — Patient Instructions (Signed)
You may take Benzonatate capsules for cough. Take 1 capsule by mouth three times daily as needed for cough.  Start Zyrtec for runny nose, watery/itchy eyes.  Take this at bedtime.  Nasal Congestion/Ear Pressure/Sinus Pressure: Try using Flonase (fluticasone) nasal spray. Instill 1 spray in each nostril twice daily.   It was a pleasure to see you today! Mayra Reel, NP-C

## 2020-12-02 ENCOUNTER — Other Ambulatory Visit: Payer: Self-pay

## 2020-12-02 ENCOUNTER — Telehealth: Payer: Self-pay | Admitting: Primary Care

## 2020-12-03 ENCOUNTER — Telehealth (INDEPENDENT_AMBULATORY_CARE_PROVIDER_SITE_OTHER): Payer: Self-pay | Admitting: Family Medicine

## 2020-12-03 DIAGNOSIS — R5381 Other malaise: Secondary | ICD-10-CM

## 2020-12-03 DIAGNOSIS — R197 Diarrhea, unspecified: Secondary | ICD-10-CM

## 2020-12-03 DIAGNOSIS — R042 Hemoptysis: Secondary | ICD-10-CM

## 2020-12-03 DIAGNOSIS — R112 Nausea with vomiting, unspecified: Secondary | ICD-10-CM

## 2020-12-03 NOTE — Progress Notes (Signed)
Virtual Visit via Telephone Note  I connected with Samuel Fields on 12/03/20 at  4:00 PM EST by telephone and verified that I am speaking with the correct person using two identifiers.   I discussed the limitations, risks, security and privacy concerns of performing an evaluation and management service by telephone and the availability of in person appointments. I also discussed with the patient that there may be a patient responsible charge related to this service. The patient expressed understanding and agreed to proceed.  Location patient: home, [place] Location provider: work or home office Participants present for the call: patient, provider Patient did not have a visit with me in the prior 7 days to address this/these issue(s).   History of Present Illness:  Acute telemedicine visit for NVD: -Onset: 2 weeks ago had bad resp infection, wife had covid at the time, he has not really improved much and thinks did have covid - but has not tested, feels better some days -however this week he got really sick again starting 11/30/20 with diarrhea - has had diarrhea > 5x per day, vomiting, abd cramps, headaches, reports has been spitting up mucus with streaks blood in it with the, reports feels really wiped out -Denies:fever, cp, sob, melena, hematochezia -Pertinent past medical history: overweight/obese -Pertinent medication allergies: -COVID-19 vaccine status: not vaccinated   Observations/Objective: Patient sounds cheerful and well on the phone. I do not appreciate any SOB. Speech and thought processing are grossly intact. Patient reported vitals:  Assessment and Plan:  Hemoptysis  Diarrhea, unspecified type  Nausea and vomiting, intractability of vomiting not specified, unspecified vomiting type  Malaise  -we discussed possible serious and likely etiologies, options for evaluation and workup, limitations of telemedicine visit vs in person visit, treatment, treatment risks and  precautions. Pt prefers to treat via telemedicine empirically rather than in person at this moment.  Given the severity and duration of his symptoms and hemoptysis, advised in person evaluation at higher level of care.  Discussed options and he plans to go to urgent care near where he lives.  He did request a work note for the days he missed work this week.  Advised further restrictions come from urgent care after evaluation.  Advised that he seek care today given the symptoms he is having.  He agrees. Work/School slipped offered: provided in patient instructions   Follow Up Instructions:  I did not refer this patient for an OV with me in the next 24 hours for this/these issue(s).  I discussed the assessment and treatment plan with the patient. The patient was provided an opportunity to ask questions and all were answered. The patient agreed with the plan and demonstrated an understanding of the instructions.   I spent 14 minutes on the date of this visit in the care of this patient. See summary of tasks completed to properly care for this patient in the detailed notes above which also included counseling of above, review of PMH, medications, allergies, evaluation of the patient and ordering and/or  instructing patient on testing and care options.     Terressa Koyanagi, DO

## 2020-12-03 NOTE — Patient Instructions (Signed)
  [name],  Please seek inperson care promptly in the next 24 hours for evaluation of your concerns.   ---------------------------------------------------------------------------------------------------------------------------    WORK SLIP:  Patient Samuel Fields,  Nov 22, 1995, was seen for a medical visit today, 12/03/20 . Please excuse from work until symptoms have resolved for 24 hours.   Sincerely: E-signature: Dr. Kriste Basque, DO [facility] Ph: [phone]   ------------------------------------------------------------------------------------------------------------------------------     I hope you are feeling better soon!   It was nice to meet you today. I help  out with telemedicine visits on Tuesdays and Thursdays and am available for visits on those days. If you have any concerns or questions following this visit please schedule a follow up visit with your Primary Care doctor or seek care at a local urgent care clinic to avoid delays in care.

## 2020-12-10 ENCOUNTER — Other Ambulatory Visit: Payer: Self-pay | Admitting: Primary Care

## 2020-12-10 DIAGNOSIS — Z20822 Contact with and (suspected) exposure to covid-19: Secondary | ICD-10-CM

## 2021-05-27 ENCOUNTER — Telehealth: Payer: Self-pay | Admitting: Family Medicine

## 2021-06-01 ENCOUNTER — Telehealth: Payer: Self-pay | Admitting: Physician Assistant

## 2021-06-01 DIAGNOSIS — Z20822 Contact with and (suspected) exposure to covid-19: Secondary | ICD-10-CM

## 2021-06-01 DIAGNOSIS — J019 Acute sinusitis, unspecified: Secondary | ICD-10-CM

## 2021-06-01 DIAGNOSIS — B9689 Other specified bacterial agents as the cause of diseases classified elsewhere: Secondary | ICD-10-CM

## 2021-06-01 MED ORDER — BENZONATATE 100 MG PO CAPS: 100.0000 mg | ORAL_CAPSULE | Freq: Three times a day (TID) | ORAL | 0 refills | Status: DC | PRN

## 2021-06-01 MED ORDER — AZITHROMYCIN 250 MG PO TABS: ORAL_TABLET | ORAL | 0 refills | Status: AC

## 2021-06-01 NOTE — Telephone Encounter (Signed)
I spoke with Samuel Fields;Samuel Fields is going to get a home covid test;  Samuel Fields has already had video visit today and was given abx and benzonatate but [facility]. Samuel Fields has not tried to find at another pharmacy. [facility]ist does have in [facility]. Samuel Fields will need to call [facility] for transfer of med. I spoke with Samuel Fields and he said his mom has already found the abx and cough med somewhere else and nothing further needed. Samuel Fields to cb ot [facility] if needed. Sending note as FYI to Allayne Gitelman NP and [place] CMA.

## 2021-06-01 NOTE — Telephone Encounter (Signed)
Please call to triage

## 2021-06-01 NOTE — Telephone Encounter (Signed)
Noted  

## 2021-06-01 NOTE — Patient Instructions (Signed)
Samuel Fields, thank you for joining [name], PA-C for today's virtual visit.  While this provider is not your primary care provider (PCP), if your PCP is located in our provider database this encounter information will be shared with them immediately following your visit.  Consent: (Patient) Samuel Fields provided verbal consent for this virtual visit at the beginning of the encounter.  Current Medications:  Current Outpatient Medications:    benzonatate (TESSALON) 200 MG capsule, Take 1 capsule (200 mg total) by mouth 3 (three) times daily as needed for cough., Disp: 30 capsule, Rfl: 0   cetirizine (ZYRTEC) 10 MG tablet, TAKE 1 TABLET BY MOUTH EVERY DAY FOR ALLERGIES, Disp: 90 tablet, Rfl: 0   diclofenac (VOLTAREN) 75 MG EC tablet, Take 1 tablet (75 mg total) by mouth 2 (two) times daily. (Patient not taking: Reported on 11/27/2020), Disp: 30 tablet, Rfl: 1   fluticasone (FLONASE) 50 MCG/ACT nasal spray, PLACE 1 SPRAY INTO BOTH NOSTRILS 2 (TWO) TIMES DAILY, Disp: 48 mL, Rfl: 0   Medications ordered in this encounter:  No orders of the defined types were placed in this encounter.    *If you need refills on other medications prior to your next appointment, please contact your pharmacy*  Follow-Up: Call back or seek an in-person evaluation if the symptoms worsen or if the condition fails to improve as anticipated.  Other Instructions As discussed, I want you to be COVID tested. You can use the link below to get scheduled.   E-Visit for Corona Virus Screening  Your current symptoms could be consistent with the coronavirus.  Many health care providers can now test patients at their office but not all are.  [facility] has multiple testing sites. For information on our COVID testing locations and hours go to [email or web address]  We are enrolling you in our MyChart Home Monitoring for COVID19 . Daily you will receive a questionnaire within the MyChart website. Our COVID 19  response team will be monitoring your responses daily.  Testing Information: The COVID-19 Community Testing sites are testing BY APPOINTMENT ONLY.  You can schedule online at [email or web address]  If you do not have access to a smart phone or computer you may call [phone] for an appointment.   Additional testing sites in the Community:  For [facility] Testing sites in [place]  FarmerBuys.com.au  For Pop-up testing sites in [place]  [email or web address]  For [facility] [email or web address]  For [facility] testing in [place] and [employer] [email or web address]  For [facility] testing in [place]   [email or web address]  For  more information about community testing call [phone]  Please keep hydrated and get plenty of rest.  Start the antibiotic as directed for sinusitis. Restart Flonase OTC and consider starting an antihistamine like Claritin.  I recommend starting Vitamin D3 1000 units daily, Vitamin C 1000 mg daily and a zinc supplement.  The tessalon is to help with cough.   Please let us know when you have your results so next steps can be determined.     If you have been instructed to have an in-person evaluation today at a local Urgent Care facility, please use the link below. It will take you to a list of all of our available [facility], including address, phone number and hours of operation. Please do not delay care.  [facility]  If you or a family member do not have a primary care provider, use the link below  to schedule a visit and establish care. When you choose a [facility] primary care  physician or advanced practice provider, you gain a long-term partner in health. Find a Primary Care Provider  Learn more about [facility] in-office and virtual care options: [facility] Get Care Now

## 2021-06-01 NOTE — Progress Notes (Signed)
Virtual Visit Consent   [name], you are scheduled for a virtual visit with a  provider today.     Just as with appointments in the office, your consent must be obtained to participate.  Your consent will be active for this visit and any virtual visit you may have with one of our providers in the next 365 days.     If you have a MyChart account, a copy of this consent can be sent to you electronically.  All virtual visits are billed to your insurance company just like a traditional visit in the office.    As this is a virtual visit, video technology does not allow for your provider to perform a traditional examination.  This may limit your provider's ability to fully assess your condition.  If your provider identifies any concerns that need to be evaluated in person or the need to arrange testing (such as labs, EKG, etc.), we will make arrangements to do so.     Although advances in technology are sophisticated, we cannot ensure that it will always work on either your end or our end.  If the connection with a video visit is poor, the visit may have to be switched to a telephone visit.  With either a video or telephone visit, we are not always able to ensure that we have a secure connection.     I need to obtain your verbal consent now.   Are you willing to proceed with your visit today?    [name] has provided verbal consent on 06/01/2021 for a virtual visit (video or telephone).   Samuel Fields, [place]   Date: 06/01/2021 11:37 AM   Virtual Visit via Video Note   I, Samuel Fields, connected with  [name]  ([id number], 02-23-1996) on 06/01/21 at 11:30 AM EDT by a video-enabled telemedicine application and verified that I am speaking with the correct person using two identifiers.  Location: Patient: Virtual Visit Location Patient: Home Provider: Virtual Visit Location Provider: Office/Clinic   I discussed the limitations of evaluation and management by  telemedicine and the availability of in person appointments. The patient expressed understanding and agreed to proceed.    History of Present Illness: Samuel Fields is a 25 y.o. who identifies as a male who was assigned male at birth, and is being seen today for URI symptoms. Patient endorses symptoms starting a couple of weeks ago with sore throat, nasal congestion, sinus pressure. Notes was gradually worsening and then improved. Over the past 24 hours has worsened with headache, fatigue, sinus pain. Denies shortness of breath or chest pain. Does note that his wife has tested positive for COVID as of last week. [name]   HPI: HPI  Problems:  Patient Active Problem List   Diagnosis Date Noted   Suspected COVID-19 virus infection 11/27/2020   Chronic pain in left foot 10/29/2018   Hyperlipidemia 07/05/2017   Elevated blood pressure reading 07/05/2017   Skin tag 07/05/2017    Allergies: No Known Allergies Medications:  Current Outpatient Medications:    azithromycin (ZITHROMAX) 250 MG tablet, Take 2 tablets on day 1, then 1 tablet daily on days 2 through 5, Disp: 6 tablet, Rfl: 0   benzonatate (TESSALON) 100 MG capsule, Take 1 capsule (100 mg total) by mouth 3 (three) times daily as needed for cough., Disp: 30 capsule, Rfl: 0  Observations/Objective: Patient is well-developed, well-nourished in no acute distress.  Resting comfortably at home.  Head is normocephalic, atraumatic.  No labored breathing. Speech is clear and coherent with logical content.  Patient is alert and oriented at baseline.   Assessment and Plan: 1. Suspected COVID-19 virus infection - benzonatate (TESSALON) 100 MG capsule; Take 1 capsule (100 mg total) by mouth 3 (three) times daily as needed for cough.  Dispense: 30 capsule; Refill: 0 - MyChart COVID-19 home monitoring program; Future  2. Acute bacterial sinusitis - azithromycin (ZITHROMAX) 250 MG tablet; Take 2 tablets on day 1, then 1 tablet daily on days 2 through  5  Dispense: 6 tablet; Refill: 0 - benzonatate (TESSALON) 100 MG capsule; Take 1 capsule (100 mg total) by mouth 3 (three) times daily as needed for cough.  Dispense: 30 capsule; Refill: 0  Patient with 2 weeks of ongoing symptoms concerning for festering bacterial sinusitis. Now with abrupt increase in fatigue and headache with known recent covid exposure. He is being sent for COVID testing and is to quarantine until results are in. OTC medications, supportive measures and vitamin regimen discussed. He has been enrolled in a symptoms monitoring program. Giving concern for his ongoing sinusitis, will also start Azithromycin and start Tessalon to help with cough. OTC antihistamine and nasal steroid recommended.   Follow Up Instructions: I discussed the assessment and treatment plan with the patient. The patient was provided an opportunity to ask questions and all were answered. The patient agreed with the plan and demonstrated an understanding of the instructions.  A copy of instructions were sent to the patient via MyChart.  The patient was advised to call back or seek an in-person evaluation if the symptoms worsen or if the condition fails to improve as anticipated.  Time:  I spent 15 minutes with the patient via telehealth technology discussing the above problems/concerns.    Samuel Climes, PA-C

## 2021-06-03 ENCOUNTER — Telehealth: Payer: Self-pay

## 2021-06-03 ENCOUNTER — Telehealth: Payer: Self-pay | Admitting: *Deleted

## 2021-06-03 NOTE — Telephone Encounter (Signed)
Called no answer no voicemail

## 2021-06-03 NOTE — Telephone Encounter (Signed)
Please call to triage

## 2021-06-03 NOTE — Progress Notes (Signed)
No show

## 2021-06-03 NOTE — Telephone Encounter (Signed)
Attempted to contact patient in regards to worsening symptoms. VM was left for patient to callback. Also mychart message was sent to patient.

## 2021-06-03 NOTE — Telephone Encounter (Signed)
Called patient to review My Chart responses to covid questionnaire. No answer, left voicemail to call back  #[phone] to review symptoms and to read My Chart message to treat symptoms. Sent message regarding worsening symptoms to patient for cough and gave recommendations to treat cough, weakness, appetite, and diarrhea. If patient needs assistance call PCP.

## 2021-06-04 ENCOUNTER — Telehealth: Payer: Self-pay | Admitting: Primary Care

## 2021-06-04 NOTE — Telephone Encounter (Signed)
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 Message left with call back number.

## 2021-06-04 NOTE — Telephone Encounter (Signed)
Called and l/m to call office not able to reach please call back later.

## 2021-06-04 NOTE — Telephone Encounter (Signed)
Noted appreciate nursing support

## 2021-06-05 ENCOUNTER — Telehealth: Payer: Self-pay | Admitting: Family

## 2021-06-05 ENCOUNTER — Encounter (INDEPENDENT_AMBULATORY_CARE_PROVIDER_SITE_OTHER): Payer: Self-pay

## 2021-06-05 DIAGNOSIS — U071 COVID-19: Secondary | ICD-10-CM

## 2021-06-06 ENCOUNTER — Encounter (INDEPENDENT_AMBULATORY_CARE_PROVIDER_SITE_OTHER): Payer: Self-pay

## 2021-06-06 ENCOUNTER — Telehealth: Payer: Self-pay

## 2021-06-06 NOTE — Telephone Encounter (Signed)
Attempted to call pt - LM on VM to call back to [phone] to discuss sx (cough and weakness).

## 2021-06-06 NOTE — Progress Notes (Signed)
    E-Visit  for Positive Covid Test Result  We are sorry you are not feeling well. We are here to help!  You have tested positive for COVID-19, meaning that you were infected with the novel coronavirus and could give the virus to others.  It is vitally important that you stay home so you do not spread it to others.      Please continue isolation at home, for at least 10 days since the start of your symptoms and until you have had 24 hours with no fever (without taking a fever reducer) and with improving of symptoms.  If you have no symptoms but tested positive (or all symptoms resolve after 5 days and you have no fever) you can leave your house but continue to wear a mask around others for an additional 5 days. If you have a fever,continue to stay home until you have had 24 hours of no fever. Most cases improve 5-10 days from onset but we have seen a small number of patients who have gotten worse after the 10 days.  Please be sure to watch for worsening symptoms and remain taking the proper precautions.   Go to the nearest hospital ED for assessment if fever/cough/breathlessness are severe or illness seems like a threat to life.    The following symptoms may appear 2-14 days after exposure: Fever Cough Shortness of breath or difficulty breathing Chills Repeated shaking with chills Muscle pain Headache Sore throat New loss of taste or smell Fatigue Congestion or runny nose Nausea or vomiting Diarrhea  I have sent a work note to Pharmacologist. After reviewing, it looks like you tested positive on 06/01/21.  You may also take acetaminophen (Tylenol) as needed for fever.  HOME CARE: Only take medications as instructed by your medical team. Drink plenty of fluids and get plenty of rest. A steam or ultrasonic humidifier can help if you have congestion.   GET HELP RIGHT AWAY IF YOU HAVE EMERGENCY WARNING SIGNS.  Call 911 or proceed to your closest emergency facility if: You develop  worsening high fever. Trouble breathing Bluish lips or face Persistent pain or pressure in the chest New confusion Inability to wake or stay awake You cough up blood. Your symptoms become more severe Inability to hold down food or fluids  This list is not all possible symptoms. Contact your medical provider for any symptoms that are severe or concerning to you.    Your e-visit answers were reviewed by a board certified advanced clinical practitioner to complete your personal care plan.  Depending on the condition, your plan could have included both over the counter or prescription medications.  If there is a problem please reply once you have received a response from your provider.  Your safety is important to [place].  If you have drug allergies check your prescription carefully.    You can use MyChart to ask questions about today's visit, request a non-urgent call back, or ask for a work or school excuse for 24 hours related to this e-Visit. If it has been greater than 24 hours you will need to follow up with your provider, or enter a new e-Visit to address those concerns. You will get an e-mail in the next two days asking about your experience.  I hope that your e-visit has been valuable and will speed your recovery. Thank you for using e-visits.   Approximately 5 minutes was spent documenting and reviewing patient's chart.

## 2021-06-06 NOTE — Telephone Encounter (Signed)
Called pt again and LM on VM. Sent pt home care for weakness and cough.

## 2021-07-02 ENCOUNTER — Encounter: Payer: Self-pay | Admitting: Primary Care

## 2021-11-22 NOTE — Telephone Encounter (Signed)
Pt's wife called back stating that they had lots of missed calls but no VM, from our office.  I told her that the triage nurse was trying to reach pt about his wrist.  Wife states that pt has appt. With Mayra Reel tomorrow morning, so no call back is needed. Pt aware that appt is at [facility].

## 2021-11-22 NOTE — Telephone Encounter (Signed)
Unable to reach anyone on phone. Sending note to [facility] CMA.

## 2021-11-22 NOTE — Telephone Encounter (Signed)
Left message on three numbers on file to call the office back.

## 2021-11-22 NOTE — Telephone Encounter (Signed)
PLEASE NOTE: All timestamps contained within this report are represented as [place] Standard Time. CONFIDENTIALTY NOTICE: This fax transmission is intended only for the addressee. It contains information that is legally privileged, confidential or otherwise protected from use or disclosure. If you are not the intended recipient, you are strictly prohibited from reviewing, disclosing, copying using or disseminating any of this information or taking any action in reliance on or regarding this information. If you have received this fax in error, please notify us immediately by telephone so that we can arrange for its return to [place]. Phone: [phone], Toll-Free: [phone], Fax: [phone] Page: 1 of 2 Call Id: [id number] [place] RECORD AccessNurse Patient Name: Samuel Fields ILL Gender: Male DOB: 01/22/1996 Age: 26 Y 8 D Return Phone Number: [phone] (Primary), [phone] (Secondary) Address: [place] [address] Client [place] Night - Client Client Site [place] Provider Alma Friendly - NP Contact Type Call Who Is Calling Patient / Member / Family / Caregiver Call Type Triage / Clinical Caller Name Samuel Fields Relationship To Patient Spouse Return Phone Number [phone] (Primary) Chief Complaint Hand Pain Reason for Call Symptomatic / Request for [place] states that her husband has wrist pain, unable to do anything with his hand because of the pain and his work is asking for a note because he called in. Caller states he needs a note for work. Translation No Nurse Assessment Nurse: Doren Custard, RN, Caryl Pina Date/Time (Eastern Time): 11/20/2021 12:58:33 PM Confirm and document reason for call. If symptomatic, describe symptoms. ---Caller states he is having wrist pain. He slipped on gravel and put his hand  down to brace and landed on his right wrist. Denies swelling. States it is sore and pain shoots through arm. Does the patient have any new or worsening symptoms? ---Yes Will a triage be completed? ---Yes Related visit to physician within the last 2 weeks? ---No Does the PT have any chronic conditions? (i.e. diabetes, asthma, this includes High risk factors for pregnancy, etc.) ---No Is this a behavioral health or substance abuse call? ---No Guidelines Guideline Title Affirmed Question Affirmed Notes Nurse Date/Time (Eastern Time) Hand and Wrist Pain Weakness (i.e., loss of strength) of newonset in hand or fingers (Exceptions: not truly weak, hand feels weak because of pain; weakness present > 2 weeks) Doren Custard, RN, Caryl Pina 11/20/2021 1:00:00 PM PLEASE NOTE: All timestamps contained within this report are represented as [place] Standard Time. CONFIDENTIALTY NOTICE: This fax transmission is intended only for the addressee. It contains information that is legally privileged, confidential or otherwise protected from use or disclosure. If you are not the intended recipient, you are strictly prohibited from reviewing, disclosing, copying using or disseminating any of this information or taking any action in reliance on or regarding this information. If you have received this fax in error, please notify us immediately by telephone so that we can arrange for its return to [place]. Phone: [phone], Toll-Free: [phone], Fax: [phone] Page: 2 of 2 Call Id: [id number] [place]. Time Eilene Ghazi Time) Disposition Final User 11/20/2021 1:06:23 PM See PCP within 24 Hours Yes Doren Custard, RN, Hulan Saas Disagree/Comply Disagree Caller Understands Yes PreDisposition Call Doctor Care Advice Given Per Guideline SEE PCP WITHIN 24 HOURS: * IF OFFICE WILL BE CLOSED: You need to be seen within the next 24 hours. A clinic or an urgent care center is often a good source of care if  your doctor's office is closed  or you can't get an appointment. PAIN MEDICINES: * For pain relief, you can take either acetaminophen, ibuprofen, or naproxen. CALL BACK IF: * You become worse CARE ADVICE given per Hand and Wrist Pain (Adult) guideline. Comments User: Dorcas Carrow, RN Date/Time Eilene Ghazi Time): 11/20/2021 1:06:23 PM Caller states they have tried to go to a few [facility] and they either don't accept his insurance or aren't taking walkins. He has an appointment with PCP for Tuesday morning. He is also wanting a note for missing work today. Advised caller to call office on Monday morning. Referrals GO TO FACILITY REFUSED

## 2021-11-23 ENCOUNTER — Telehealth: Payer: BLUE CROSS/BLUE SHIELD | Admitting: Family Medicine

## 2021-11-23 ENCOUNTER — Ambulatory Visit: Payer: BLUE CROSS/BLUE SHIELD | Admitting: Primary Care

## 2021-11-23 DIAGNOSIS — M25531 Pain in right wrist: Secondary | ICD-10-CM

## 2021-11-23 MED ORDER — IBUPROFEN 600 MG PO TABS: 600.0000 mg | ORAL_TABLET | Freq: Three times a day (TID) | ORAL | 0 refills | Status: DC | PRN

## 2021-11-23 NOTE — Patient Instructions (Signed)
Wrist Sprain, Adult A wrist sprain is a stretch or tear in the strong tissues that connect the wrist bones to each other. These strong tissues are called ligaments. There are three types of wrist sprains: Grade 1. The ligament is stretched more than normal. There may be a minor amount of wrist pain. Grade 2. The ligament is partially torn. You may be able to move your wrist, but not very much. There may be a moderate amount of wrist pain. Grade 3. The ligament or ligaments are completely torn. You may find it difficult to move your wrist even a little. There may be a significant amount of wrist pain. What are the causes? This condition may be caused by using the wrist too much during sports,exercise, or work. It can also happen due to a fall or during an accident. What increases the risk? You are more likely to develop this condition if: You had a previous wrist or arm injury. You have poor wrist strength and flexibility. You play contact sports, such as football or soccer. You participate in sports that may result in a fall, such as skateboarding, biking, skiing, or snowboarding. You do not exercise regularly. You use exercise equipment that does not fit well. What are the signs or symptoms? Symptoms of this condition include: Pain in the wrist, arm, or hand. Swelling or bruised skin near the wrist, hand, or arm. The skin may look yellow or blue. Stiffness or trouble moving the hand. Hearing a noise, like a pop or a snap, at the time of injury, or feeling a tear at the time of the injury. A warm feeling in the skin around the wrist. How is this diagnosed? This condition is diagnosed with a physical exam. Sometimes an X-ray is taken to make sure a bone did not break. You may also have an MRI of your wrist tocheck for torn ligaments. How is this treated? This condition is treated by resting and applying ice to your wrist. Additional treatment may include: Taking medicine for pain and  inflammation. Wearing a splint, brace, or cast for a short period of time to keep your wrist from moving (immobilized). Doing exercises to strengthen and stretch your wrist. Having surgery. This may be done if the ligament is completely torn. Follow these instructions at home: If you have a splint or brace: Wear the splint or brace as told by your health care provider. Remove it only as told by your health care provider. Loosen it if your fingers tingle, become numb, or turn cold and blue. Keep it clean. If the splint or brace is not waterproof: Do not let it get wet. Cover it with a watertight covering when you take a bath or a shower. If you have a cast: Do not put pressure on any part of the cast until it is fully hardened. This may take several hours. Do not stick anything inside the cast to scratch your skin. Doing that increases your risk of infection. Check the skin around the cast every day. Tell your health care provider about any concerns. You may put lotion on dry skin around the edges of the cast. Do not put lotion on the skin underneath the cast. Keep it clean. If the cast is not waterproof: Do not let it get wet. Cover it with a watertight covering when you take a bath or shower. Managing pain, stiffness, and swelling  If directed, put ice on the injured area. To do this: If you have a removable splint   or brace, remove it as told by your health care provider. Put ice in a plastic bag. Place a towel between your skin and the bag or between the splint or cast and the bag. Leave the ice on for 20 minutes, 2-3 times a day. Remove the ice if your skin turns bright red. This is very important. If you cannot feel pain, heat, or cold, you have a greater risk of damage to the area. Move your fingers often to reduce stiffness and swelling. Raise (elevate) the injured area above the level of your heart while you are sitting or lying down.  Activity Rest your wrist as told by your  health care provider. Do not do things that cause pain. Ask your health care provider when it is safe to drive if you have a splint, brace, or cast on your wrist. Do exercises as told by your health care provider. Return to your normal activities as told by your health care provider. Ask your health care provider what activities are safe for you. General instructions Take over-the-counter and prescription medicines only as told by your health care provider. Do not use any products that contain nicotine or tobacco, such as cigarettes, e-cigarettes, and chewing tobacco. These can delay healing. If you need help quitting, ask your health care provider. Keep all follow-up visits. This is important. Contact a health care provider if: Your pain, bruising, or swelling gets worse. Your skin becomes red, gets a rash, or has open sores. Your pain does not get better or it gets worse. Get help right away if: You have a new or sudden sharp pain in the hand, arm, or wrist. You have tingling or numbness in your hand. Your fingers turn white, very red, or cold and blue. You cannot move your fingers. Summary A wrist sprain is damage to ligaments in your wrist. Wrist sprains can range from mild to severe. Return to your normal activities as told by your health care provider. Ask your health care provider what activities are safe for you. You may need to wear a splint, brace, or cast for a short period of time. This information is not intended to replace advice given to you by your health care provider. Make sure you discuss any questions you have with your healthcare provider. Document Revised: 02/17/2020 Document Reviewed: 02/17/2020 Elsevier Patient Education  2022 Elsevier Inc.  

## 2021-11-23 NOTE — Progress Notes (Signed)
Virtual Visit Consent   Samuel Fields, you are scheduled for a virtual visit with a [place] provider today.     Just as with appointments in the office, your consent must be obtained to participate.  Your consent will be active for this visit and any virtual visit you may have with one of our providers in the next 365 days.     If you have a MyChart account, a copy of this consent can be sent to you electronically.  All virtual visits are billed to your insurance company just like a traditional visit in the office.    As this is a virtual visit, video technology does not allow for your provider to perform a traditional examination.  This may limit your provider's ability to fully assess your condition.  If your provider identifies any concerns that need to be evaluated in person or the need to arrange testing (such as labs, EKG, etc.), we will make arrangements to do so.     Although advances in technology are sophisticated, we cannot ensure that it will always work on either your end or our end.  If the connection with a video visit is poor, the visit may have to be switched to a telephone visit.  With either a video or telephone visit, we are not always able to ensure that we have a secure connection.     I need to obtain your verbal consent now.   Are you willing to proceed with your visit today?    Samuel Fields has provided verbal consent on 11/23/2021 for a virtual visit (video or telephone).   Freddy Finner, NP   Date: 11/23/2021 8:57 AM   Virtual Visit via Video Note   I, Freddy Finner, connected with  Samuel Fields  ([id number], October 31, 1995) on 11/23/21 at  8:45 AM EST by a video-enabled telemedicine application and verified that I am speaking with the correct person using two identifiers.  Location: Patient: Virtual Visit Location Patient: Home Provider: Virtual Visit Location Provider: Home Office   I discussed the limitations of evaluation and management by  telemedicine and the availability of in person appointments. The patient expressed understanding and agreed to proceed.    History of Present Illness: Samuel Fields is a 26 y.o. who identifies as a male who was assigned male at birth, and is being seen today for right wrist pain. Pt reports that on Friday he was playing with his kids, slipped on the gravel- placed right hand down, landed on the wrist area and it started hurt. Reports thinking it is sprained. Is able to move it well, took ibuprofen at home it improved some. Denies bruising, sensation changes, weakness or loss of function. Declines UC for xray- reports he feels it is improving.  Problems:  Patient Active Problem List   Diagnosis Date Noted   Suspected COVID-19 virus infection 11/27/2020   Chronic pain in left foot 10/29/2018   Hyperlipidemia 07/05/2017   Elevated blood pressure reading 07/05/2017   Skin tag 07/05/2017    Allergies: No Known Allergies Medications:  Current Outpatient Medications:    benzonatate (TESSALON) 100 MG capsule, Take 1 capsule (100 mg total) by mouth 3 (three) times daily as needed for cough., Disp: 30 capsule, Rfl: 0  Observations/Objective: Patient is well-developed, well-nourished in no acute distress.  Resting comfortably  at home.  Head is normocephalic, atraumatic.  No labored breathing.  Speech is clear and coherent with logical content.  Patient is alert and oriented at baseline.  Wrist has full movement- limited exam due to type of visit  Assessment and Plan: 1. Right wrist pain Ibuprofen and RICE measures reviewed- almost 4 days out from injury date- (no work note provided given since this was not the day if injury).   Encouraged to f/u in person if symptoms suddenly worsened or did not continue to improve as they have been  - ibuprofen (ADVIL) 600 MG tablet; Take 1 tablet (600 mg total) by mouth every 8 (eight) hours as needed for mild pain or moderate pain.  Dispense: 30 tablet;  Refill: 0  NSAID precautions were reviewed with the patient.  Patient should take medication with food, discontinue if develops GI side effects, not to take other OTC NSAIDs at the same time, and not to use longer than recommended.   Reviewed side effects, risks and benefits of medication.    Patient acknowledged agreement and understanding of the plan.    Follow Up Instructions: I discussed the assessment and treatment plan with the patient. The patient was provided an opportunity to ask questions and all were answered. The patient agreed with the plan and demonstrated an understanding of the instructions.  A copy of instructions were sent to the patient via MyChart unless otherwise noted below.   The patient was advised to call back or seek an in-person evaluation if the symptoms worsen or if the condition fails to improve as anticipated.  Time:  I spent 10 minutes with the patient via telehealth technology discussing the above problems/concerns.    Perlie Mayo, NP

## 2021-11-27 ENCOUNTER — Emergency Department: Payer: BLUE CROSS/BLUE SHIELD

## 2021-11-27 ENCOUNTER — Other Ambulatory Visit: Payer: Self-pay

## 2021-11-27 ENCOUNTER — Emergency Department
Admission: EM | Admit: 2021-11-27 | Discharge: 2021-11-27 | Disposition: A | Discharge status: Home / Self Care | Payer: BLUE CROSS/BLUE SHIELD | Attending: Emergency Medicine | Admitting: Emergency Medicine

## 2021-11-27 DIAGNOSIS — M79671 Pain in right foot: Secondary | ICD-10-CM | POA: Diagnosis present

## 2021-11-27 NOTE — ED Provider Notes (Signed)
[facility] Provider Note    Event Date/Time   First MD Initiated Contact with Patient 11/27/21 1213     (approximate)   History   Foot Pain   HPI Samuel Fields is a 26 y.o. male with a stated past medical history of of stress fracture in the left foot who presents for right lateral foot pain.  Patient states that he works in a warehouse job during the weekends and will occasionally have lateral foot pain in one of the other foot.  Patient states that over the last few weeks he has been having worsening right lateral foot pain whenever he is working as well as coming off shift.  Patient states that he has been off throughout the middle of the week and states that the pain improved throughout this time before going back to work and it worsening again.  Patient does state that he is usually stands on the outside of his feet and feels that this worsens his pain.  Patient also recently attempted to buy different shoes but unfortunately this has not helped his pain.  Patient denies any numbness, paresthesias, or weakness in this foot.  Patient denies any trauma     Physical Exam   Triage Vital Signs: ED Triage Vitals  Enc Vitals Group     BP 11/27/21 1202 (!) 135/98     Pulse Rate 11/27/21 1202 93     Resp 11/27/21 1202 16     Temp 11/27/21 1202 97.8 F (36.6 C)     Temp Source 11/27/21 1202 Oral     SpO2 11/27/21 1202 97 %     Weight 11/27/21 1200 240 lb (108.9 kg)     Height 11/27/21 1200 5\' 9"  (1.753 m)     Head Circumference --      Peak Flow --      Pain Score 11/27/21 1159 8     Pain Loc --      Pain Edu? --      Excl. in GC? --     Most recent vital signs: Vitals:   11/27/21 1202  BP: (!) 135/98  Pulse: 93  Resp: 16  Temp: 97.8 F (36.6 C)  SpO2: 97%    General: Awake, no distress.  CV:  Good peripheral perfusion.  Resp:  Normal effort.  Abd:  No distention.  Other:  Right lateral foot pain with toe extension and flexion actively or  passively as well as generalized tenderness on the dorsum of the right foot over the fourth and fifth metatarsal   ED Results / Procedures / Treatments   Labs (all labs ordered are listed, but only abnormal results are displayed) Labs Reviewed - No data to display RADIOLOGY ED MD interpretation: Three-view x-ray of the right foot shows no evidence of acute abnormalities.  Agree with radiology assessment  Official radiology report(s): DG Foot Complete Right  Result Date: 11/27/2021 CLINICAL DATA:  Right foot pain. EXAM: RIGHT FOOT COMPLETE - 3+ VIEW COMPARISON:  08/12/2020 FINDINGS: There is no evidence of fracture or dislocation. There is no evidence of arthropathy or other focal bone abnormality. Soft tissues are unremarkable. IMPRESSION: Negative. Electronically Signed   By: 08/14/2020 M.D.   On: 11/27/2021 12:38      PROCEDURES:  Critical Care performed: No  Procedures   MEDICATIONS ORDERED IN ED: Medications - No data to display   IMPRESSION / MDM / ASSESSMENT AND PLAN / ED COURSE  I reviewed the triage  vital signs and the nursing notes.                              Patient is a 26 year old male who presents for right foot pain in the setting of job where he is on his feet all day Given history, exam and workup I have low suspicion for fracture, dislocation, significant ligamentous injury, septic arthritis, gout flare, new autoimmune arthropathy, or gonococcal arthropathy.  Interventions: X-ray of this right foot does not show any evidence of acute abnormalities  -Discussed with patient at length the need for hard soled shoe and/or a lateral foot stability insert/insole -Patient given physical therapy exercises to perform at home as well as orthopedic follow-up as needed Disposition: Discharge home with strict return precautions and instructions for prompt primary care follow up in the next week.       FINAL CLINICAL IMPRESSION(S) / ED DIAGNOSES   Final  diagnoses:  Foot pain, right     Rx / DC Orders   ED Discharge Orders     None        Note:  This document was prepared using Dragon voice recognition software and may include unintentional dictation errors.   Merwyn Katos, MD 11/27/21 [phone]

## 2021-11-27 NOTE — ED Triage Notes (Signed)
Pt comes pov with right foot pain. States that he doesn't know what he did to it. States that it happened a few years ago with his left foot and he had a hairline fracture.

## 2021-11-27 NOTE — Discharge Instructions (Addendum)
-  Please obtain lateral sole wedge inserts or shoe inserts for lateral stability and use them anytime you are at work -Please keep the foot elevated above the level of your heart is much as possible as well as placing an ice pack on the lateral aspect of the right foot anytime you are getting off work -Please use ibuprofen (Motrin) up to 800 mg every 8 hours, naproxen (Naprosyn) up to 500 mg every 12 hours, and/or acetaminophen (Tylenol) up to 4 g/day for any continued pain

## 2021-11-30 ENCOUNTER — Encounter: Payer: Self-pay | Admitting: Primary Care

## 2021-11-30 ENCOUNTER — Ambulatory Visit (INDEPENDENT_AMBULATORY_CARE_PROVIDER_SITE_OTHER): Payer: BLUE CROSS/BLUE SHIELD | Admitting: Primary Care

## 2021-11-30 ENCOUNTER — Other Ambulatory Visit: Payer: Self-pay

## 2021-11-30 VITALS — BP 118/88 | HR 97 | Temp 98.3°F | Resp 18 | Ht 69.0 in | Wt 241.2 lb

## 2021-11-30 DIAGNOSIS — E785 Hyperlipidemia, unspecified: Secondary | ICD-10-CM

## 2021-11-30 DIAGNOSIS — M79671 Pain in right foot: Secondary | ICD-10-CM | POA: Diagnosis not present

## 2021-11-30 DIAGNOSIS — Z8249 Family history of ischemic heart disease and other diseases of the circulatory system: Secondary | ICD-10-CM

## 2021-11-30 LAB — URIC ACID: Uric Acid, Serum: 7.3 mg/dL (ref 4.0–7.8)

## 2021-11-30 LAB — CBC
HCT: 47.1 % (ref 39.0–52.0)
Hemoglobin: 16.4 g/dL (ref 13.0–17.0)
MCHC: 34.8 g/dL (ref 30.0–36.0)
MCV: 94.4 fl (ref 78.0–100.0)
Platelets: 338 10*3/uL (ref 150.0–400.0)
RBC: 4.99 Mil/uL (ref 4.22–5.81)
RDW: 13.1 % (ref 11.5–15.5)
WBC: 8.2 10*3/uL (ref 4.0–10.5)

## 2021-11-30 MED ORDER — METHYLPREDNISOLONE ACETATE 80 MG/ML IJ SUSP
80.0000 mg | Freq: Once | INTRAMUSCULAR | Status: AC
  Administered 2021-11-30: 80 mg via INTRAMUSCULAR

## 2021-11-30 MED ORDER — MELOXICAM 15 MG PO TABS: 15.0000 mg | ORAL_TABLET | Freq: Every day | ORAL | 0 refills | Status: DC | PRN

## 2021-11-30 NOTE — Patient Instructions (Signed)
You may take meloxicam 15 mg once daily for pain and inflammation. Avoid taking Ibuprofen while taking meloxicam.  You will be contacted regarding your referral to podiatry.  Please let us know if you have not been contacted within two weeks.   It was a pleasure to see you today!

## 2021-11-30 NOTE — Assessment & Plan Note (Signed)
No obvious injury, no wounds.  Reviewed ED notes and xray of right foot from recent visit, no fracture seen.  Will rule out gout by obtaining CBC and uric acid levels.  Referral placed to Podiatry. IM Depo medrol 80 mg provided today.  Rx for Meloxicam 15 mg provided to take once daily as needed. Discussed to avoid other NSAID's.

## 2021-11-30 NOTE — Progress Notes (Signed)
Subjective:    Patient ID: Samuel Fields, male    DOB: 1996/02/07, 26 y.o.   MRN: [id number]  HPI  [name] is a very pleasant 26 y.o. male with a history of chronic left foot pain, hyperlipidemia, elevated blood pressure reading who presents today to discuss foot pain.  His pain is located to the right dorsal and plantar foot to the 3rd, 4th,and 5th toes to the ankle. Symptoms began several weeks ago, increased four days ago and occur with applying weight and moderate palpation. He's noticed swelling to the toes and dorsal foot. He denies injury or trauma, redness.   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 He believes he wears comfortable shoes with insoles.   Evaluated by podiatry years ago for left foot pain, was told he had a hairline fracture, was provided with a steroid injection in the foot and symptoms resolved.   He presented to [facility] ED on 11/27/21, endorsed at this visit that his foot pain began weeks ago, worse recently. He underwent xray of his right foot which was negative for fracture. He was told to obtain hard soled shoes/lateral foot stability insole. He was provided with home PT exercises and to follow up with PT or PCP.   He's been taking Ibuprofen and Tylenol without improvement. His significant other gave him one of her Tramadol tablets with some improvement.    Review of Systems  Musculoskeletal:  Positive for arthralgias.  Skin:  Negative for color change and wound.  Neurological:  Negative for numbness.        Past Medical History:  Diagnosis Date   Allergy     Social History   Socioeconomic History   Marital status: Married    Spouse name: Not on file   Number of children: Not on file   Years of education: Not on file   Highest education level: Not on file  Occupational History   Not on file  Tobacco Use   Smoking status: Former   Smokeless tobacco: Former  Water quality scientist Use: Every day  Substance and Sexual Activity   Alcohol use: Yes   Drug use: Not on file   Sexual activity: Not on file  Other Topics Concern   Not on file  Social History Narrative   Single.   No children.   Not working.    Enjoys spending time with friends.   Social Determinants of Health   Financial Resource Strain: Not on file  Food Insecurity: Not on file  Transportation Needs: Not on file  Physical Activity: Not on file  Stress: Not on file  Social Connections: Not on file  Intimate Partner Violence: Not on file    No past surgical history on file.  Family History  Problem Relation Age of Onset   Hypertension Mother    Hypertension Father    Diabetes Father    Heart attack Father    Skin cancer Maternal Grandmother    Prostate cancer Maternal Grandfather     No Known Allergies  Current Outpatient Medications on File Prior to Visit  Medication Sig Dispense Refill   benzonatate (TESSALON) 100 MG capsule Take 1 capsule (100 mg total) by mouth 3 (three) times daily as needed for cough. 30 capsule 0   No current facility-administered medications on file prior to visit.    BP 118/88 (BP Location: Left  Arm, Patient Position: Sitting, Cuff Size: Large)    Pulse 97    Temp 98.3 F (36.8 C) (Oral)    Resp 18    Ht 5\' 9"  (1.753 m)    Wt 241 lb 3.2 oz (109.4 kg)    SpO2 96%    BMI 35.62 kg/m  Objective:   Physical Exam Cardiovascular:     Pulses:          Dorsalis pedis pulses are 2+ on the right side.       Posterior tibial pulses are 2+ on the right side.  Musculoskeletal:     Right foot: Normal range of motion.       Feet:  Feet:     Right foot:     Skin integrity: Skin integrity normal.     Comments: Pain with palpation to dorsal foot.  Pain with dorsoflexion of left foot.  Ambulates in office with limp. Able to get up and down from exam table.          Assessment & Plan:      This visit occurred during the SARS-CoV-2 public  health emergency.  Safety protocols were in place, including screening questions prior to the visit, additional usage of staff PPE, and extensive cleaning of exam room while observing appropriate contact time as indicated for disinfecting solutions.

## 2021-12-03 ENCOUNTER — Other Ambulatory Visit: Payer: Self-pay

## 2021-12-03 ENCOUNTER — Ambulatory Visit: Payer: BLUE CROSS/BLUE SHIELD | Admitting: Podiatry

## 2021-12-03 ENCOUNTER — Ambulatory Visit: Payer: BLUE CROSS/BLUE SHIELD | Admitting: Primary Care

## 2021-12-07 ENCOUNTER — Ambulatory Visit (INDEPENDENT_AMBULATORY_CARE_PROVIDER_SITE_OTHER): Payer: BLUE CROSS/BLUE SHIELD | Admitting: Primary Care

## 2021-12-07 ENCOUNTER — Other Ambulatory Visit: Payer: Self-pay

## 2021-12-07 ENCOUNTER — Encounter: Payer: Self-pay | Admitting: Primary Care

## 2021-12-07 VITALS — BP 120/78 | HR 83 | Temp 97.5°F | Ht 69.0 in | Wt 240.0 lb

## 2021-12-07 DIAGNOSIS — Z8249 Family history of ischemic heart disease and other diseases of the circulatory system: Secondary | ICD-10-CM | POA: Diagnosis not present

## 2021-12-07 DIAGNOSIS — Z1159 Encounter for screening for other viral diseases: Secondary | ICD-10-CM

## 2021-12-07 DIAGNOSIS — Z0000 Encounter for general adult medical examination without abnormal findings: Secondary | ICD-10-CM | POA: Insufficient documentation

## 2021-12-07 DIAGNOSIS — Z114 Encounter for screening for human immunodeficiency virus [HIV]: Secondary | ICD-10-CM

## 2021-12-07 DIAGNOSIS — E785 Hyperlipidemia, unspecified: Secondary | ICD-10-CM | POA: Diagnosis not present

## 2021-12-07 DIAGNOSIS — M79671 Pain in right foot: Secondary | ICD-10-CM

## 2021-12-07 LAB — CBC
HCT: 48.4 % (ref 39.0–52.0)
Hemoglobin: 16.5 g/dL (ref 13.0–17.0)
MCHC: 34.2 g/dL (ref 30.0–36.0)
MCV: 94.2 fl (ref 78.0–100.0)
Platelets: 357 10*3/uL (ref 150.0–400.0)
RBC: 5.13 Mil/uL (ref 4.22–5.81)
RDW: 12.9 % (ref 11.5–15.5)
WBC: 9.8 10*3/uL (ref 4.0–10.5)

## 2021-12-07 LAB — HEMOGLOBIN A1C: Hgb A1c MFr Bld: 5.1 % (ref 4.6–6.5)

## 2021-12-07 NOTE — Assessment & Plan Note (Signed)
Significant family of CAD with father diagnosed at age 28.  Long discussion today regarding the need for weight loss through a healthy diet and regular exercise.   Repeat lipid panel pending. Will also check A1C.

## 2021-12-07 NOTE — Progress Notes (Signed)
Subjective:    Patient ID: Samuel Fields, male    DOB: August 29, 1996, 26 y.o.   MRN: [id number]  HPI  Samuel Fields is a very pleasant 26 y.o. male who presents today for complete physical and follow up of chronic conditions.  He was denied by podiatry as they don't take his insurance. His right foot pain has improved, has been out of work for one week.   Immunizations: -Tetanus: 2018 -Influenza: Declines -Covid-19: Has not completed   Diet: Fair diet.  Exercise: No regular exercise. Active at work.   Eye exam: No recent visit  Dental exam: Completes annually   BP Readings from Last 3 Encounters:  12/07/21 120/78  11/30/21 118/88  11/27/21 (!) 135/98      Review of Systems  Constitutional:  Negative for unexpected weight change.  HENT:  Negative for rhinorrhea.   Eyes:  Negative for visual disturbance.  Respiratory:  Negative for cough and shortness of breath.   Cardiovascular:  Negative for chest pain.  Gastrointestinal:  Negative for constipation and diarrhea.  Genitourinary:  Negative for difficulty urinating.  Musculoskeletal:  Negative for arthralgias and myalgias.  Skin:  Negative for rash.  Allergic/Immunologic: Negative for environmental allergies.  Neurological:  Negative for dizziness and headaches.  Psychiatric/Behavioral:  The patient is not nervous/anxious.         Past Medical History:  Diagnosis Date   Allergy     Social History   Socioeconomic History   Marital status: Married    Spouse name: Not on file   Number of children: Not on file   Years of education: Not on file   Highest education level: Not on file  Occupational History   Not on file  Tobacco Use   Smoking status: Former   Smokeless tobacco: Former  Building services engineer Use: Every day  Substance and Sexual Activity   Alcohol use: Yes   Drug use: Not on file   Sexual activity: Not on file  Other Topics Concern   Not on file  Social History Narrative   Single.   No  children.   Not working.    Enjoys spending time with friends.   Social Determinants of Health   Financial Resource Strain: Not on file  Food Insecurity: Not on file  Transportation Needs: Not on file  Physical Activity: Not on file  Stress: Not on file  Social Connections: Not on file  Intimate Partner Violence: Not on file    History reviewed. No pertinent surgical history.  Family History  Problem Relation Age of Onset   Hypertension Mother    Hypertension Father    Diabetes Father    Heart attack Father    Skin cancer Maternal Grandmother    Prostate cancer Maternal Grandfather     No Known Allergies  Current Outpatient Medications on File Prior to Visit  Medication Sig Dispense Refill   meloxicam (MOBIC) 15 MG tablet Take 1 tablet (15 mg total) by mouth daily as needed for pain. 30 tablet 0   No current facility-administered medications on file prior to visit.    BP 120/78    Pulse 83    Temp (!) 97.5 F (36.4 C) (Oral)    Ht 5\' 9"  (1.753 m)    Wt 240 lb (108.9 kg)    SpO2 96%    BMI 35.44 kg/m  Objective:   Physical Exam HENT:     Right Ear: Tympanic membrane and ear canal normal.  Left Ear: Tympanic membrane and ear canal normal.     Nose: Nose normal.     Right Sinus: No maxillary sinus tenderness or frontal sinus tenderness.     Left Sinus: No maxillary sinus tenderness or frontal sinus tenderness.  Eyes:     Conjunctiva/sclera: Conjunctivae normal.  Neck:     Thyroid: No thyromegaly.     Vascular: No carotid bruit.  Cardiovascular:     Rate and Rhythm: Normal rate and regular rhythm.     Heart sounds: Normal heart sounds.  Pulmonary:     Effort: Pulmonary effort is normal.     Breath sounds: Normal breath sounds. No wheezing or rales.  Abdominal:     General: Bowel sounds are normal.     Palpations: Abdomen is soft.     Tenderness: There is no abdominal tenderness.  Musculoskeletal:        General: Normal range of motion.     Cervical back:  Neck supple.  Skin:    General: Skin is warm and dry.  Neurological:     Mental Status: He is alert and oriented to person, place, and time.     Cranial Nerves: No cranial nerve deficit.     Deep Tendon Reflexes: Reflexes are normal and symmetric.  Psychiatric:        Mood and Affect: Mood normal.          Assessment & Plan:      This visit occurred during the SARS-CoV-2 public health emergency.  Safety protocols were in place, including screening questions prior to the visit, additional usage of staff PPE, and extensive cleaning of exam room while observing appropriate contact time as indicated for disinfecting solutions.

## 2021-12-07 NOTE — Assessment & Plan Note (Signed)
Tetanus UTD. Declines influenza vaccine. Discussed the importance of a healthy diet and regular exercise in order for weight loss, and to reduce the risk of further co-morbidity.  Exam today stable. Labs pending. 

## 2021-12-07 NOTE — Assessment & Plan Note (Signed)
In father at age 26.  Will check lipids and A1C today. BP under control.  Encouraged weight loss through diet and exercise.

## 2021-12-07 NOTE — Assessment & Plan Note (Signed)
Improved and nearly resolved with rest. Insurance denied podiatry evaluation.  Continue Meloxicam 15 mg PRN.

## 2021-12-07 NOTE — Patient Instructions (Signed)
Stop by the lab prior to leaving today. I will notify you of your results once received.   Start exercising. You should be getting 150 minutes of moderate intensity exercise weekly.  It's important to improve your diet by reducing consumption of fast food, fried food, processed snack foods, sugary drinks. Increase consumption of fresh vegetables and fruits, whole grains, water.  Ensure you are drinking 64 ounces of water daily.  It was a pleasure to see you today!  Preventive Care 19-33 Years Old, Male Preventive care refers to lifestyle choices and visits with your health care provider that can promote health and wellness. Preventive care visits are also called wellness exams. What can I expect for my preventive care visit? Counseling During your preventive care visit, your health care provider may ask about your: Medical history, including: Past medical problems. Family medical history. Current health, including: Emotional well-being. Home life and relationship well-being. Sexual activity. Lifestyle, including: Alcohol, nicotine or tobacco, and drug use. Access to firearms. Diet, exercise, and sleep habits. Safety issues such as seatbelt and bike helmet use. Sunscreen use. Work and work Astronomer. Physical exam Your health care provider may check your: Height and weight. These may be used to calculate your BMI (body mass index). BMI is a measurement that tells if you are at a healthy weight. Waist circumference. This measures the distance around your waistline. This measurement also tells if you are at a healthy weight and may help predict your risk of certain diseases, such as type 2 diabetes and high blood pressure. Heart rate and blood pressure. Body temperature. Skin for abnormal spots. What immunizations do I need? Vaccines are usually given at various ages, according to a schedule. Your health care provider will recommend vaccines for you based on your age, medical  history, and lifestyle or other factors, such as travel or where you work. What tests do I need? Screening Your health care provider may recommend screening tests for certain conditions. This may include: Lipid and cholesterol levels. Diabetes screening. This is done by checking your blood sugar (glucose) after you have not eaten for a while (fasting). Hepatitis B test. Hepatitis C test. HIV (human immunodeficiency virus) test. STI (sexually transmitted infection) testing, if you are at risk. Talk with your health care provider about your test results, treatment options, and if necessary, the need for more tests. Follow these instructions at home: Eating and drinking  Eat a healthy diet that includes fresh fruits and vegetables, whole grains, lean protein, and low-fat dairy products. Drink enough fluid to keep your urine pale yellow. Take vitamin and mineral supplements as recommended by your health care provider. Do not drink alcohol if your health care provider tells you not to drink. If you drink alcohol: Limit how much you have to 0-2 drinks a day. Know how much alcohol is in your drink. In the [place] one drink equals one 12 oz bottle of beer (355 mL), one 5 oz glass of wine (148 mL), or one 1 oz glass of hard liquor (44 mL). Lifestyle Brush your teeth every morning and night with fluoride toothpaste. Floss one time each day. Exercise for at least 30 minutes 5 or more days each week. Do not use any products that contain nicotine or tobacco. These products include cigarettes, chewing tobacco, and vaping devices, such as e-cigarettes. If you need help quitting, ask your health care provider. Do not use drugs. If you are sexually active, practice safe sex. Use a condom or other form of  protection to prevent STIs. Find healthy ways to manage stress, such as: Meditation, yoga, or listening to music. Journaling. Talking to a trusted person. Spending time with friends and  family. Minimize exposure to UV radiation to reduce your risk of skin cancer. Safety Always wear your seat belt while driving or riding in a vehicle. Do not drive: If you have been drinking alcohol. Do not ride with someone who has been drinking. If you have been using any mind-altering substances or drugs. While texting. When you are tired or distracted. Wear a helmet and other protective equipment during sports activities. If you have firearms in your house, make sure you follow all gun safety procedures. Seek help if you have been physically or sexually abused. What's next? Go to your health care provider once a year for an annual wellness visit. Ask your health care provider how often you should have your eyes and teeth checked. Stay up to date on all vaccines. This information is not intended to replace advice given to you by your health care provider. Make sure you discuss any questions you have with your health care provider. Document Revised: 04/07/2021 Document Reviewed: 04/07/2021 Elsevier Patient Education  2022 [employer].

## 2021-12-08 ENCOUNTER — Ambulatory Visit: Payer: BLUE CROSS/BLUE SHIELD | Admitting: Podiatry

## 2021-12-08 LAB — LIPID PANEL
Cholesterol: 246 mg/dL — ABNORMAL HIGH (ref 0–200)
HDL: 51.3 mg/dL (ref >39.00)
NonHDL: 195.02
Total CHOL/HDL Ratio: 5
Triglycerides: 278 mg/dL — ABNORMAL HIGH (ref 0.0–149.0)
VLDL: 55.6 mg/dL — ABNORMAL HIGH (ref 0.0–40.0)

## 2021-12-08 LAB — COMPREHENSIVE METABOLIC PANEL
ALT: 119 U/L — ABNORMAL HIGH (ref 0–53)
AST: 34 U/L (ref 0–37)
Albumin: 5.2 g/dL (ref 3.5–5.2)
Alkaline Phosphatase: 71 U/L (ref 39–117)
BUN: 24 mg/dL — ABNORMAL HIGH (ref 6–23)
CO2: 33 mEq/L — ABNORMAL HIGH (ref 19–32)
Calcium: 10.2 mg/dL (ref 8.4–10.5)
Chloride: 97 mEq/L (ref 96–112)
Creatinine, Ser: 0.99 mg/dL (ref 0.40–1.50)
GFR: 103.99 mL/min (ref >60.00)
Glucose, Bld: 74 mg/dL (ref 70–99)
Potassium: 5 mEq/L (ref 3.5–5.1)
Sodium: 137 mEq/L (ref 135–145)
Total Bilirubin: 0.5 mg/dL (ref 0.2–1.2)
Total Protein: 8.4 g/dL — ABNORMAL HIGH (ref 6.0–8.3)

## 2021-12-08 LAB — LDL CHOLESTEROL, DIRECT: Direct LDL: 175 mg/dL

## 2021-12-08 LAB — HEPATITIS C ANTIBODY
Hepatitis C Ab: NONREACTIVE
SIGNAL TO CUT-OFF: <0.02 (ref <1.00)

## 2021-12-08 LAB — HIV ANTIBODY (ROUTINE TESTING W REFLEX): HIV 1&2 Ab, 4th Generation: NONREACTIVE

## 2021-12-09 ENCOUNTER — Telehealth: Payer: Self-pay | Admitting: Primary Care

## 2021-12-09 DIAGNOSIS — R7989 Other specified abnormal findings of blood chemistry: Secondary | ICD-10-CM

## 2021-12-09 NOTE — Telephone Encounter (Signed)
Noted, will place orders for ultrasound.

## 2021-12-09 NOTE — Telephone Encounter (Signed)
Pt wife called back to let Jae Dire know that he wanted to scheduled the [place]

## 2021-12-09 NOTE — Addendum Note (Signed)
Addended by: Doreene Nest on: 12/09/2021 01:19 PM   Modules accepted: Orders

## 2021-12-10 NOTE — Telephone Encounter (Signed)
I don't see records of him going to podiatry in Care Everywhere. Can we get proof that he was seen?

## 2021-12-13 NOTE — Telephone Encounter (Signed)
I have called and left message for return call to see if I can get notes but it looks like the there are some in now for the provider he has listed. Is that the notes you are looking for?

## 2021-12-14 ENCOUNTER — Encounter: Payer: Self-pay | Admitting: *Deleted

## 2021-12-14 NOTE — Telephone Encounter (Signed)
Have called left 2 message to call office so that we can get notes. Do you want me to just have patient make appointment with office for note?

## 2021-12-22 ENCOUNTER — Ambulatory Visit: Payer: BLUE CROSS/BLUE SHIELD | Admitting: Primary Care

## 2021-12-23 ENCOUNTER — Ambulatory Visit: Payer: BLUE CROSS/BLUE SHIELD

## 2021-12-28 ENCOUNTER — Ambulatory Visit: Payer: BLUE CROSS/BLUE SHIELD | Admitting: Primary Care

## 2022-01-20 ENCOUNTER — Ambulatory Visit: Payer: BLUE CROSS/BLUE SHIELD | Admitting: Primary Care

## 2022-02-08 ENCOUNTER — Ambulatory Visit: Payer: BLUE CROSS/BLUE SHIELD | Admitting: Primary Care

## 2022-04-02 ENCOUNTER — Telehealth: Payer: BLUE CROSS/BLUE SHIELD | Admitting: Nurse Practitioner

## 2022-04-02 DIAGNOSIS — Z20818 Contact with and (suspected) exposure to other bacterial communicable diseases: Secondary | ICD-10-CM

## 2022-04-02 DIAGNOSIS — J020 Streptococcal pharyngitis: Secondary | ICD-10-CM | POA: Diagnosis not present

## 2022-04-02 MED ORDER — AMOXICILLIN 875 MG PO TABS: 875.0000 mg | ORAL_TABLET | Freq: Two times a day (BID) | ORAL | 0 refills | Status: DC

## 2022-04-02 NOTE — Progress Notes (Signed)
Virtual Visit Consent   Samuel Fields, you are scheduled for a virtual visit with Mary-Margaret Daphine Deutscher, FNP, a [facility] provider, today.     Just as with appointments in the office, your consent must be obtained to participate.  Your consent will be active for this visit and any virtual visit you may have with one of our providers in the next 365 days.     If you have a MyChart account, a copy of this consent can be sent to you electronically.  All virtual visits are billed to your insurance company just like a traditional visit in the office.    As this is a virtual visit, video technology does not allow for your provider to perform a traditional examination.  This may limit your provider's ability to fully assess your condition.  If your provider identifies any concerns that need to be evaluated in person or the need to arrange testing (such as labs, EKG, etc.), we will make arrangements to do so.     Although advances in technology are sophisticated, we cannot ensure that it will always work on either your end or our end.  If the connection with a video visit is poor, the visit may have to be switched to a telephone visit.  With either a video or telephone visit, we are not always able to ensure that we have a secure connection.     I need to obtain your verbal consent now.   Are you willing to proceed with your visit today? YES   AARIT KASHUBA has provided verbal consent on 04/02/2022 for a virtual visit (video or telephone).   Mary-Margaret Daphine Deutscher, FNP   Date: 04/02/2022 6:09 PM   Virtual Visit via Video Note   I, Mary-Margaret Daphine Deutscher, connected with Samuel Fields ([id number], [birth year]) on 04/02/22 at  6:15 PM EDT by a video-enabled telemedicine application and verified that I am speaking with the correct person using two identifiers.  Location: Patient: Virtual Visit Location Patient: Home Provider: Virtual Visit Location Provider: Mobile   I discussed the limitations of  evaluation and management by telemedicine and the availability of in person appointments. The patient expressed understanding and agreed to proceed.    History of Present Illness: Samuel Fields is a 26 y.o. who identifies as a male who was assigned male at birth, and is being seen today for sore throat.  HPI: Patient does video visit c/o sore throat  Sore Throat  This is a new problem. The current episode started yesterday. The problem has been gradually worsening. Neither side of throat is experiencing more pain than the other. The maximum temperature recorded prior to his arrival was 100.4 - 100.9 F. The fever has been present for Less than 1 day. The pain is at a severity of 7/10. The pain is moderate. Associated symptoms include trouble swallowing. Pertinent negatives include no congestion, coughing or swollen glands. He has had exposure to strep (his daugther had strep throat last week). He has tried nothing for the symptoms. The treatment provided no relief.    Review of Systems  Constitutional:  Negative for chills and fever.  HENT:  Positive for sore throat and trouble swallowing. Negative for congestion.   Respiratory:  Negative for cough.     Problems:  Patient Active Problem List   Diagnosis Date Noted   Preventative health care 12/07/2021   Family history of early CAD 12/07/2021   Acute foot pain, right 11/30/2021   Chronic  pain in left foot 10/29/2018   Hyperlipidemia 07/05/2017   Elevated blood pressure reading 07/05/2017   Skin tag 07/05/2017    Allergies: No Known Allergies Medications:  Current Outpatient Medications:    meloxicam (MOBIC) 15 MG tablet, Take 1 tablet (15 mg total) by mouth daily as needed for pain., Disp: 30 tablet, Rfl: 0  Observations/Objective: Patient is well-developed, well-nourished in no acute distress.  Resting comfortably  at home.  Head is normocephalic, atraumatic.  No labored breathing.  Speech is clear and coherent with logical  content.  Patient is alert and oriented at baseline.  Oculd not visualize the throat in video visit  Assessment and Plan:  [name] in today with chief complaint of Sore Throat   1. Pharyngitis due to Streptococcus species 2. Streptococcus exposure Force fluids Motrin or tylenol OTC OTC decongestant Throat lozenges if help New toothbrush in 3 days  Meds ordered this encounter  Medications   amoxicillin (AMOXIL) 875 MG tablet    Sig: Take 1 tablet (875 mg total) by mouth 2 (two) times daily. 1 po BID    Dispense:  14 tablet    Refill:  0    Order Specific Question:   Supervising Provider    Answer:   Eber Hong [[id number]]      Follow Up Instructions: I discussed the assessment and treatment plan with the patient. The patient was provided an opportunity to ask questions and all were answered. The patient agreed with the plan and demonstrated an understanding of the instructions.  A copy of instructions were sent to the patient via MyChart.  The patient was advised to call back or seek an in-person evaluation if the symptoms worsen or if the condition fails to improve as anticipated.  Time:  I spent 10 minutes with the patient via telehealth technology discussing the above problems/concerns.    Mary-Margaret Daphine Deutscher, FNP

## 2022-04-02 NOTE — Patient Instructions (Signed)

## 2022-04-03 ENCOUNTER — Telehealth: Payer: BLUE CROSS/BLUE SHIELD | Admitting: Family

## 2022-04-03 DIAGNOSIS — J029 Acute pharyngitis, unspecified: Secondary | ICD-10-CM

## 2022-04-03 NOTE — Progress Notes (Signed)
Pt requesting new work note. Will no charge this visit and new work note sent to [employer]. Continue Amoxicillin and new toothbrush in 3 days.   Evelina Dun, FNP

## 2022-04-09 ENCOUNTER — Telehealth: Payer: BLUE CROSS/BLUE SHIELD | Admitting: Nurse Practitioner

## 2022-04-09 ENCOUNTER — Encounter: Payer: Self-pay | Admitting: Nurse Practitioner

## 2022-04-09 ENCOUNTER — Encounter: Payer: BLUE CROSS/BLUE SHIELD | Admitting: Nurse Practitioner

## 2022-04-09 DIAGNOSIS — M778 Other enthesopathies, not elsewhere classified: Secondary | ICD-10-CM

## 2022-04-09 MED ORDER — PREDNISONE 10 MG PO TABS: ORAL_TABLET | ORAL | 0 refills | Status: DC

## 2022-04-09 NOTE — Progress Notes (Signed)
Virtual Visit Consent   MAUDE GLOOR, you are scheduled for a virtual visit with a [facility] provider today. Just as with appointments in the office, your consent must be obtained to participate. Your consent will be active for this visit and any virtual visit you may have with one of our providers in the next 365 days. If you have a MyChart account, a copy of this consent can be sent to you electronically.  As this is a virtual visit, video technology does not allow for your provider to perform a traditional examination. This may limit your provider's ability to fully assess your condition. If your provider identifies any concerns that need to be evaluated in person or the need to arrange testing (such as labs, EKG, etc.), we will make arrangements to do so. Although advances in technology are sophisticated, we cannot ensure that it will always work on either your end or our end. If the connection with a video visit is poor, the visit may have to be switched to a telephone visit. With either a video or telephone visit, we are not always able to ensure that we have a secure connection.  By engaging in this virtual visit, you consent to the provision of healthcare and authorize for your insurance to be billed (if applicable) for the services provided during this visit. Depending on your insurance coverage, you may receive a charge related to this service.  I need to obtain your verbal consent now. Are you willing to proceed with your visit today? Samuel Fields has provided verbal consent on 04/09/2022 for a virtual visit (video or telephone). Claiborne Rigg, NP  Date: 04/09/2022 5:26 PM  Virtual Visit via Video Note   I, Claiborne Rigg, connected with  Samuel Fields  ([id number], 11/29/1995) on 04/09/22 at  5:30 PM EDT by a video-enabled telemedicine application and verified that I am speaking with the correct person using two identifiers.  Location: Patient: Virtual Visit Location Patient:  Home Provider: Virtual Visit Location Provider: Home Office   I discussed the limitations of evaluation and management by telemedicine and the availability of in person appointments. The patient expressed understanding and agreed to proceed.    History of Present Illness: Samuel Fields is a 26 y.o. 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 Currently endorses increased pain. Last podiatry visit was 11-2021. He was most recently prescribed meloxicam which he reports as ineffective. Requesting an alternative such as tramadol or tylenol #3. He is also requesting a prescription sleep aid however this would be his first time starting a sleep medication. As this is his first time he has been redirected back to his PCP.   4Problems:  Patient Active Problem List   Diagnosis Date Noted   Preventative health care 12/07/2021   Family history of early CAD 12/07/2021   Acute foot pain, right 11/30/2021   Chronic pain in left foot 10/29/2018   Hyperlipidemia 07/05/2017   Elevated blood pressure reading 07/05/2017   Skin tag 07/05/2017    Allergies: No Known Allergies Medications:  Current Outpatient Medications:    predniSONE (DELTASONE) 10 MG tablet, Directions for 6 day taper: Day 1: 2 tablets before breakfast, 1 after both lunch & dinner and 2 at bedtime Day 2: 1 tab before breakfast, 1 after both lunch & dinner and 2  at bedtime Day 3: 1 tab at each meal & 1 at bedtime Day 4: 1 tab at breakfast, 1 at lunch, 1 at bedtime Day 5: 1 tab at breakfast & 1 tab at bedtime Day 6: 1 tab at breakfast, Disp: 21 tablet, Rfl: 0   amoxicillin (AMOXIL) 875 MG tablet, Take 1 tablet (875 mg total) by mouth 2 (two) times daily. 1 po BID, Disp: 14 tablet, Rfl: 0   meloxicam (MOBIC) 15 MG tablet, Take 1 tablet (15 mg total) by mouth daily as needed for pain., Disp: 30 tablet, Rfl:  0  Observations/Objective: Patient is well-developed, well-nourished in no acute distress.  Resting comfortably  at home.  Head is normocephalic, atraumatic.  No labored breathing.  Speech is clear and coherent with logical content.  Patient is alert and oriented at baseline.    Assessment and Plan: 1. Enthesopathy of foot - predniSONE (DELTASONE) 10 MG tablet; Directions for 6 day taper: Day 1: 2 tablets before breakfast, 1 after both lunch & dinner and 2 at bedtime Day 2: 1 tab before breakfast, 1 after both lunch & dinner and 2 at bedtime Day 3: 1 tab at each meal & 1 at bedtime Day 4: 1 tab at breakfast, 1 at lunch, 1 at bedtime Day 5: 1 tab at breakfast & 1 tab at bedtime Day 6: 1 tab at breakfast  Dispense: 21 tablet; Refill: 0 Needs to return to wearing cam boot until seen by Podiatry.  Follow Up Instructions: I discussed the assessment and treatment plan with the patient. The patient was provided an opportunity to ask questions and all were answered. The patient agreed with the plan and demonstrated an understanding of the instructions.  A copy of instructions were sent to the patient via MyChart unless otherwise noted below.    The patient was advised to call back or seek an in-person evaluation if the symptoms worsen or if the condition fails to improve as anticipated.  Time:  I spent 12 minutes with the patient via telehealth technology discussing the above problems/concerns.    Claiborne Rigg, NP

## 2022-04-09 NOTE — Progress Notes (Signed)
I have spent 5 minutes in review of e-visit questionnaire, review and updating patient chart, medical decision making and response to patient.  ° °Io Dieujuste W Gearald Stonebraker, NP ° °  °

## 2022-04-09 NOTE — Progress Notes (Signed)
Hello [name] We already spoke on the virtual video earlier. I have sent prednisone for your foot pain already to the pharmacy at [facility]. There will be no charge for this evisit.

## 2022-04-09 NOTE — Patient Instructions (Signed)
  Samuel Fields, thank you for joining Claiborne Rigg, NP for today's virtual visit.  While this provider is not your primary care provider (PCP), if your PCP is located in our provider database this encounter information will be shared with them immediately following your visit.  Consent: (Patient) Samuel Fields provided verbal consent for this virtual visit at the beginning of the encounter.  Current Medications:  Current Outpatient Medications:    predniSONE (DELTASONE) 10 MG tablet, Directions for 6 day taper: Day 1: 2 tablets before breakfast, 1 after both lunch & dinner and 2 at bedtime Day 2: 1 tab before breakfast, 1 after both lunch & dinner and 2 at bedtime Day 3: 1 tab at each meal & 1 at bedtime Day 4: 1 tab at breakfast, 1 at lunch, 1 at bedtime Day 5: 1 tab at breakfast & 1 tab at bedtime Day 6: 1 tab at breakfast, Disp: 21 tablet, Rfl: 0   amoxicillin (AMOXIL) 875 MG tablet, Take 1 tablet (875 mg total) by mouth 2 (two) times daily. 1 po BID, Disp: 14 tablet, Rfl: 0   meloxicam (MOBIC) 15 MG tablet, Take 1 tablet (15 mg total) by mouth daily as needed for pain., Disp: 30 tablet, Rfl: 0   Medications ordered in this encounter:  Meds ordered this encounter  Medications   predniSONE (DELTASONE) 10 MG tablet    Sig: Directions for 6 day taper: Day 1: 2 tablets before breakfast, 1 after both lunch & dinner and 2 at bedtime Day 2: 1 tab before breakfast, 1 after both lunch & dinner and 2 at bedtime Day 3: 1 tab at each meal & 1 at bedtime Day 4: 1 tab at breakfast, 1 at lunch, 1 at bedtime Day 5: 1 tab at breakfast & 1 tab at bedtime Day 6: 1 tab at breakfast    Dispense:  21 tablet    Refill:  0    Order Specific Question:   Supervising Provider    Answer:   Eber Hong [[id number]]     *If you need refills on other medications prior to your next appointment, please contact your pharmacy*  Follow-Up: Call back or seek an in-person evaluation if the symptoms worsen or if the  condition fails to improve as anticipated.  Other Instructions Instructed to follow up with podiatry or PCP   If you have been instructed to have an in-person evaluation today at a local Urgent Care facility, please use the link below. It will take you to a list of all of our available [facility], including address, phone number and hours of operation. Please do not delay care.  [facility]  If you or a family member do not have a primary care provider, use the link below to schedule a visit and establish care. When you choose a [facility] primary care physician or advanced practice provider, you gain a long-term partner in health. Find a Primary Care Provider  Learn more about [facility]'s in-office and virtual care options: [facility] - Get Care Now

## 2022-05-04 ENCOUNTER — Telehealth: Payer: BLUE CROSS/BLUE SHIELD | Admitting: Primary Care

## 2022-06-07 ENCOUNTER — Telehealth: Payer: BLUE CROSS/BLUE SHIELD | Admitting: Physician Assistant

## 2022-06-07 DIAGNOSIS — J069 Acute upper respiratory infection, unspecified: Secondary | ICD-10-CM | POA: Diagnosis not present

## 2022-06-07 MED ORDER — BENZONATATE 100 MG PO CAPS
100.0000 mg | ORAL_CAPSULE | Freq: Three times a day (TID) | ORAL | 0 refills | Status: AC
Start: 2022-06-07 — End: 2022-06-12

## 2022-06-07 MED ORDER — FLUTICASONE PROPIONATE 50 MCG/ACT NA SUSP: 2.0000 | Freq: Every day | NASAL | 0 refills | Status: DC

## 2022-06-07 NOTE — Patient Instructions (Signed)
  Samuel Fields, thank you for joining Karrie Meres, PA-C for today's virtual visit.  While this provider is not your primary care provider (PCP), if your PCP is located in our provider database this encounter information will be shared with them immediately following your visit.  Consent: (Patient) Samuel Fields provided verbal consent for this virtual visit at the beginning of the encounter.  Current Medications:  Current Outpatient Medications:    benzonatate (TESSALON) 100 MG capsule, Take 1 capsule (100 mg total) by mouth every 8 (eight) hours for 5 days., Disp: 15 capsule, Rfl: 0   fluticasone (FLONASE) 50 MCG/ACT nasal spray, Place 2 sprays into both nostrils daily., Disp: 16 g, Rfl: 0   amoxicillin (AMOXIL) 875 MG tablet, Take 1 tablet (875 mg total) by mouth 2 (two) times daily. 1 po BID, Disp: 14 tablet, Rfl: 0   meloxicam (MOBIC) 15 MG tablet, Take 1 tablet (15 mg total) by mouth daily as needed for pain., Disp: 30 tablet, Rfl: 0   predniSONE (DELTASONE) 10 MG tablet, Directions for 6 day taper: Day 1: 2 tablets before breakfast, 1 after both lunch & dinner and 2 at bedtime Day 2: 1 tab before breakfast, 1 after both lunch & dinner and 2 at bedtime Day 3: 1 tab at each meal & 1 at bedtime Day 4: 1 tab at breakfast, 1 at lunch, 1 at bedtime Day 5: 1 tab at breakfast & 1 tab at bedtime Day 6: 1 tab at breakfast, Disp: 21 tablet, Rfl: 0   Medications ordered in this encounter:  Meds ordered this encounter  Medications   benzonatate (TESSALON) 100 MG capsule    Sig: Take 1 capsule (100 mg total) by mouth every 8 (eight) hours for 5 days.    Dispense:  15 capsule    Refill:  0    Order Specific Question:   Supervising Provider    Answer:   MILLER, BRIAN [[id number]]   fluticasone (FLONASE) 50 MCG/ACT nasal spray    Sig: Place 2 sprays into both nostrils daily.    Dispense:  16 g    Refill:  0    Order Specific Question:   Supervising Provider    Answer:   Hyacinth Meeker, BRIAN [[id number]]      *If you need refills on other medications prior to your next appointment, please contact your pharmacy*  Follow-Up: Call back or seek an in-person evaluation if the symptoms worsen or if the condition fails to improve as anticipated.  Other Instructions Take tessalon for cough and fluticasone for congestion   Follow up with your regular doctor in 1 week for reassessment and seek care sooner if your symptoms worsen or fail to improve.    If you have been instructed to have an in-person evaluation today at a local Urgent Care facility, please use the link below. It will take you to a list of all of our available [facility], including address, phone number and hours of operation. Please do not delay care.  [facility]  If you or a family member do not have a primary care provider, use the link below to schedule a visit and establish care. When you choose a [facility] primary care physician or advanced practice provider, you gain a long-term partner in health. Find a Primary Care Provider  Learn more about [facility]'s in-office and virtual care options: [facility] - Get Care Now

## 2022-06-07 NOTE — Progress Notes (Signed)
Mr. markevious, ehmke are scheduled for a virtual visit with your provider today.    Just as we do with appointments in the office, we must obtain your consent to participate.  Your consent will be active for this visit and any virtual visit you may have with one of our providers in the next 365 days.    If you have a MyChart account, I can also send a copy of this consent to you electronically.  All virtual visits are billed to your insurance company just like a traditional visit in the office.  As this is a virtual visit, video technology does not allow for your provider to perform a traditional examination.  This may limit your provider's ability to fully assess your condition.  If your provider identifies any concerns that need to be evaluated in person or the need to arrange testing such as labs, EKG, etc, we will make arrangements to do so.    Although advances in technology are sophisticated, we cannot ensure that it will always work on either your end or our end.  If the connection with a video visit is poor, we may have to switch to a telephone visit.  With either a video or telephone visit, we are not always able to ensure that we have a secure connection.   I need to obtain your verbal consent now.   Are you willing to proceed with your visit today?   RAIJON LINDFORS has provided verbal consent on 06/07/2022 for a virtual visit (video or telephone).   Karrie Meres, PA-C 06/07/2022  3:32 PM   Date:  06/07/2022   ID:  [name], DOB 1996/02/27, MRN [id number]  Patient Location: Home Provider Location: Home Office   Participants: Patient and Provider for Visit and Wrap up  Method of visit: Video  Location of Patient: Home Location of Provider: Home Office Consent was obtain for visit over the video. Services rendered by provider: Visit was performed via video  A video enabled telemedicine application was used and I verified that I am speaking with the correct person using two  identifiers.  PCP:  Doreene Nest, NP   Chief Complaint:  Glenford Peers  History of Present Illness:    LENO MATHES is a 26 y.o. male with history as stated below. Presents video telehealth for an acute care visit  Pt reports body aches, cough, sore throat that started a few days ago. He has been taking over the counter medications and states he feels much improved. Denies chest pain, sob. His main reason for the visit is for a work note   Past Medical, Surgical, Social History, Allergies, and Medications have been Reviewed.  Past Medical History:  Diagnosis Date   Allergy     No outpatient medications have been marked as taking for the 06/07/22 encounter (Appointment) with [facility] PROVIDER.     Allergies:   Patient has no known allergies.   ROS See HPI for history of present illness.  Physical Exam Pulmonary:     Comments: Speaking in full sentences Neurological:     Mental Status: He is alert.         MDM: pt c/o uri sxs. Missed work this week. Is feeling better but needs a work note.       There are no diagnoses linked to this encounter.   Time:   Today, I have spent 6 minutes with the patient with telehealth technology discussing the above problems, reviewing the chart, previous  notes, medications and orders.    Tests Ordered: No orders of the defined types were placed in this encounter.   Medication Changes: No orders of the defined types were placed in this encounter.    Disposition:  Follow up  Signed, Karrie Meres, PA-C  06/07/2022 3:32 PM

## 2022-06-08 ENCOUNTER — Telehealth: Payer: BLUE CROSS/BLUE SHIELD | Admitting: Physician Assistant

## 2022-06-08 ENCOUNTER — Encounter: Payer: Self-pay | Admitting: Nurse Practitioner

## 2022-06-08 DIAGNOSIS — J069 Acute upper respiratory infection, unspecified: Secondary | ICD-10-CM

## 2022-06-09 ENCOUNTER — Telehealth: Payer: BLUE CROSS/BLUE SHIELD | Admitting: Physician Assistant

## 2022-06-09 DIAGNOSIS — R059 Cough, unspecified: Secondary | ICD-10-CM

## 2022-06-09 DIAGNOSIS — R112 Nausea with vomiting, unspecified: Secondary | ICD-10-CM

## 2022-06-09 DIAGNOSIS — Z7689 Persons encountering health services in other specified circumstances: Secondary | ICD-10-CM

## 2022-06-09 MED ORDER — ONDANSETRON HCL 4 MG PO TABS: 4.0000 mg | ORAL_TABLET | Freq: Three times a day (TID) | ORAL | 0 refills | Status: DC | PRN

## 2022-06-09 NOTE — Progress Notes (Signed)
Consent completed on prior visit   Karrie Meres, PA-C 06/09/2022  2:22 PM   Date:  06/09/2022   ID:  Samuel Fields, DOB 06-27-1996, MRN [id number]  Patient Location: Home Provider Location: Home Office   Participants: Patient and Provider for Visit and Wrap up  Method of visit: Video  Location of Patient: Home Location of Provider: Home Office Consent was obtain for visit over the video. Services rendered by provider: Visit was performed via video  A video enabled telemedicine application was used and I verified that I am speaking with the correct person using two identifiers.  PCP:  Doreene Nest, NP   Chief Complaint:  work note  History of Present Illness:    Samuel Fields is a 26 y.o. male with history as stated below. Presents video telehealth for an acute care visit  Pt asking for work note to be rewritten.  Past Medical, Surgical, Social History, Allergies, and Medications have been Reviewed.  Past Medical History:  Diagnosis Date   Allergy     No outpatient medications have been marked as taking for the 06/09/22 encounter (Video Visit) with [facility] PROVIDER.     Allergies:   Patient has no known allergies.   ROS See HPI for history of present illness.  Physical Exam            1. Return to work evaluation Asking for work note to be rewritten    Time:   Today, I have spent 5 minutes with the patient with telehealth technology discussing the above problems, reviewing the chart, previous notes, medications and orders.    Tests Ordered: No orders of the defined types were placed in this encounter.   Medication Changes: No orders of the defined types were placed in this encounter.    Disposition:  Follow up  Signed, Kaitlyn Franko Manfred Shirts, PA-C  06/09/2022 2:22 PM

## 2022-06-09 NOTE — Patient Instructions (Signed)
  Samuel Fields, thank you for joining Karrie Meres, PA-C for today's virtual visit.  While this provider is not your primary care provider (PCP), if your PCP is located in our provider database this encounter information will be shared with them immediately following your visit.  Consent: (Patient) Samuel Fields provided verbal consent for this virtual visit at the beginning of the encounter.  Current Medications:  Current Outpatient Medications:    ondansetron (ZOFRAN) 4 MG tablet, Take 1 tablet (4 mg total) by mouth every 8 (eight) hours as needed for nausea or vomiting., Disp: 20 tablet, Rfl: 0   amoxicillin (AMOXIL) 875 MG tablet, Take 1 tablet (875 mg total) by mouth 2 (two) times daily. 1 po BID, Disp: 14 tablet, Rfl: 0   benzonatate (TESSALON) 100 MG capsule, Take 1 capsule (100 mg total) by mouth every 8 (eight) hours for 5 days., Disp: 15 capsule, Rfl: 0   fluticasone (FLONASE) 50 MCG/ACT nasal spray, Place 2 sprays into both nostrils daily., Disp: 16 g, Rfl: 0   meloxicam (MOBIC) 15 MG tablet, Take 1 tablet (15 mg total) by mouth daily as needed for pain., Disp: 30 tablet, Rfl: 0   predniSONE (DELTASONE) 10 MG tablet, Directions for 6 day taper: Day 1: 2 tablets before breakfast, 1 after both lunch & dinner and 2 at bedtime Day 2: 1 tab before breakfast, 1 after both lunch & dinner and 2 at bedtime Day 3: 1 tab at each meal & 1 at bedtime Day 4: 1 tab at breakfast, 1 at lunch, 1 at bedtime Day 5: 1 tab at breakfast & 1 tab at bedtime Day 6: 1 tab at breakfast, Disp: 21 tablet, Rfl: 0   Medications ordered in this encounter:  Meds ordered this encounter  Medications   ondansetron (ZOFRAN) 4 MG tablet    Sig: Take 1 tablet (4 mg total) by mouth every 8 (eight) hours as needed for nausea or vomiting.    Dispense:  20 tablet    Refill:  0    Order Specific Question:   Supervising Provider    Answer:   Hyacinth Meeker, BRIAN [[id number]]     *If you need refills on other medications prior to  your next appointment, please contact your pharmacy*  Follow-Up: Call back or seek an in-person evaluation if the symptoms worsen or if the condition fails to improve as anticipated.  Other Instructions You were given a prescription for zofran to help with your nausea. Please take as directed.  Follow up with your regular doctor in 1 week for reassessment and seek care sooner if your symptoms worsen or fail to improve.    If you have been instructed to have an in-person evaluation today at a local Urgent Care facility, please use the link below. It will take you to a list of all of our available [facility], including address, phone number and hours of operation. Please do not delay care.  [facility]  If you or a family member do not have a primary care provider, use the link below to schedule a visit and establish care. When you choose a [facility] primary care physician or advanced practice provider, you gain a long-term partner in health. Find a Primary Care Provider  Learn more about [facility]'s in-office and virtual care options: [facility] - Get Care Now

## 2022-06-09 NOTE — Progress Notes (Signed)
    NOTE: There will be NO CHARGE for this eVisit   If you are having a true medical emergency please call 911.      For an urgent face to face visit, [facility] has seven urgent care centers for your convenience:     [facility] Health Urgent Care Center at [facility] Directions [phone] [address] [place], [place] [address]    [facility] Health Urgent Care Center [facility]) Get Driving Directions [phone] [address] [place], [place] [address]  [facility] Health Urgent Care Center [facility]) Get Driving Directions [phone] [address] [place],  [place]  [address]  [facility] Health Urgent Care Center [facility] [employer] Directions  [phone] 224 671 [address] [place],  [place] [address]   [facility] Health Urgent Care at [facility] Get Driving Directions [phone] [address] [place], [place] [address]   [facility] Health Urgent Care at [facility] Get Driving Directions  [phone] [address] [place], [place] [address]   [facility] Health Urgent Care at [facility] Directions [phone] [address], [address] [place], [place] [address]  Your MyChart E-visit questionnaire answers were reviewed by a board certified advanced clinical practitioner to complete your personal care plan based on your specific symptoms.  Thank you for using e-Visits.   Approximately 5 minutes was spent documenting and reviewing patient's chart.

## 2022-06-09 NOTE — Progress Notes (Signed)
No show

## 2022-06-09 NOTE — Progress Notes (Signed)
Samuel Fields, Samuel Fields are scheduled for a virtual visit with your provider today.    Just as we do with appointments in the office, we must obtain your consent to participate.  Your consent will be active for this visit and any virtual visit you may have with one of our providers in the next 365 days.    If you have a MyChart account, I can also send a copy of this consent to you electronically.  All virtual visits are billed to your insurance company just like a traditional visit in the office.  As this is a virtual visit, video technology does not allow for your provider to perform a traditional examination.  This may limit your provider's ability to fully assess your condition.  If your provider identifies any concerns that need to be evaluated in person or the need to arrange testing such as labs, EKG, etc, we will make arrangements to do so.    Although advances in technology are sophisticated, we cannot ensure that it will always work on either your end or our end.  If the connection with a video visit is poor, we may have to switch to a telephone visit.  With either a video or telephone visit, we are not always able to ensure that we have a secure connection.   I need to obtain your verbal consent now.   Are you willing to proceed with your visit today?   Samuel Fields has provided verbal consent on 06/09/2022 for a virtual visit (video or telephone).   Karrie Meres, PA-C 06/09/2022  1:39 PM   Date:  06/09/2022   ID:  Samuel Fields, DOB 06/09/1996, MRN [id number]  Patient Location: Home Provider Location: Home Office   Participants: Patient and Provider for Visit and Wrap up  Method of visit: Video  Location of Patient: Home Location of Provider: Home Office Consent was obtain for visit over the video. Services rendered by provider: Visit was performed via video  A video enabled telemedicine application was used and I verified that I am speaking with the correct person using two  identifiers.  PCP:  Doreene Nest, NP   Chief Complaint:  nv, cough  History of Present Illness:    Samuel Fields is a 26 y.o. male with history as stated below. Presents video telehealth for an acute care visit  Pt seen for uri sxs several days ago. Was needing a work note to be excused from work. He was feeling better but started vomiting last night and now needs another day off. Has been able to tolerate po today. No abd pain or fevers today  Past Medical, Surgical, Social History, Allergies, and Medications have been Reviewed.  Past Medical History:  Diagnosis Date   Allergy     Current Meds  Medication Sig   ondansetron (ZOFRAN) 4 MG tablet Take 1 tablet (4 mg total) by mouth every 8 (eight) hours as needed for nausea or vomiting.     Allergies:   Patient has no known allergies.   ROS See HPI for history of present illness.  Physical Exam Constitutional:      General: He is not in acute distress. Neurological:     Mental Status: He is alert.    MDM: pt with uri sxs, starting vomiting last night. No abd pain or fevers. Zofran given. Is asking for work note for earlier this week and today.             There  are no diagnoses linked to this encounter.   Time:   Today, I have spent 6 minutes with the patient with telehealth technology discussing the above problems, reviewing the chart, previous notes, medications and orders.    Tests Ordered: No orders of the defined types were placed in this encounter.   Medication Changes: Meds ordered this encounter  Medications   ondansetron (ZOFRAN) 4 MG tablet    Sig: Take 1 tablet (4 mg total) by mouth every 8 (eight) hours as needed for nausea or vomiting.    Dispense:  20 tablet    Refill:  0    Order Specific Question:   Supervising Provider    Answer:   Eber Hong [[id number]]     Disposition:  Follow up  Signed, Bryann Mcnealy Manfred Shirts, PA-C  06/09/2022 1:39 PM

## 2022-09-29 ENCOUNTER — Emergency Department
Admission: EM | Admit: 2022-09-29 | Discharge: 2022-09-29 | Disposition: A | Discharge status: Home / Self Care | Payer: BLUE CROSS/BLUE SHIELD | Attending: Emergency Medicine | Admitting: Emergency Medicine

## 2022-09-29 ENCOUNTER — Other Ambulatory Visit: Payer: Self-pay

## 2022-09-29 ENCOUNTER — Emergency Department: Payer: BLUE CROSS/BLUE SHIELD

## 2022-09-29 DIAGNOSIS — N132 Hydronephrosis with renal and ureteral calculous obstruction: Secondary | ICD-10-CM | POA: Diagnosis not present

## 2022-09-29 DIAGNOSIS — R7401 Elevation of levels of liver transaminase levels: Secondary | ICD-10-CM | POA: Insufficient documentation

## 2022-09-29 DIAGNOSIS — N200 Calculus of kidney: Secondary | ICD-10-CM

## 2022-09-29 DIAGNOSIS — R1031 Right lower quadrant pain: Secondary | ICD-10-CM | POA: Diagnosis present

## 2022-09-29 LAB — URINALYSIS, ROUTINE W REFLEX MICROSCOPIC
Bilirubin Urine: NEGATIVE
Glucose, UA: NEGATIVE mg/dL
Ketones, ur: 20 mg/dL — AB
Leukocytes,Ua: NEGATIVE
Nitrite: NEGATIVE
Protein, ur: 100 mg/dL — AB
RBC / HPF: >50 RBC/hpf — ABNORMAL HIGH (ref 0–5)
Specific Gravity, Urine: 1.025 (ref 1.005–1.030)
Squamous Epithelial / HPF: NONE SEEN (ref 0–5)
pH: 6 (ref 5.0–8.0)

## 2022-09-29 LAB — COMPREHENSIVE METABOLIC PANEL
ALT: 117 U/L — ABNORMAL HIGH (ref 0–44)
AST: 48 U/L — ABNORMAL HIGH (ref 15–41)
Albumin: 4.6 g/dL (ref 3.5–5.0)
Alkaline Phosphatase: 57 U/L (ref 38–126)
Anion gap: 10 (ref 5–15)
BUN: 18 mg/dL (ref 6–20)
CO2: 23 mmol/L (ref 22–32)
Calcium: 9.2 mg/dL (ref 8.9–10.3)
Chloride: 105 mmol/L (ref 98–111)
Creatinine, Ser: 0.9 mg/dL (ref 0.61–1.24)
GFR, Estimated: >60 mL/min (ref >60)
Glucose, Bld: 134 mg/dL — ABNORMAL HIGH (ref 70–99)
Potassium: 3.9 mmol/L (ref 3.5–5.1)
Sodium: 138 mmol/L (ref 135–145)
Total Bilirubin: 1 mg/dL (ref 0.3–1.2)
Total Protein: 8.2 g/dL — ABNORMAL HIGH (ref 6.5–8.1)

## 2022-09-29 LAB — CBC
HCT: 45.4 % (ref 39.0–52.0)
Hemoglobin: 15.9 g/dL (ref 13.0–17.0)
MCH: 32.3 pg (ref 26.0–34.0)
MCHC: 35 g/dL (ref 30.0–36.0)
MCV: 92.3 fL (ref 80.0–100.0)
Platelets: 329 10*3/uL (ref 150–400)
RBC: 4.92 MIL/uL (ref 4.22–5.81)
RDW: 12 % (ref 11.5–15.5)
WBC: 10.4 10*3/uL (ref 4.0–10.5)
nRBC: 0 % (ref 0.0–0.2)

## 2022-09-29 MED ORDER — TAMSULOSIN HCL 0.4 MG PO CAPS: 0.4000 mg | ORAL_CAPSULE | Freq: Every day | ORAL | 0 refills | Status: DC

## 2022-09-29 MED ORDER — KETOROLAC TROMETHAMINE 10 MG PO TABS: 10.0000 mg | ORAL_TABLET | Freq: Four times a day (QID) | ORAL | 0 refills | Status: DC | PRN

## 2022-09-29 MED ORDER — ONDANSETRON 4 MG PO TBDP: 4.0000 mg | ORAL_TABLET | Freq: Three times a day (TID) | ORAL | 0 refills | Status: DC | PRN

## 2022-09-29 MED ORDER — OXYCODONE-ACETAMINOPHEN 5-325 MG PO TABS: 1.0000 | ORAL_TABLET | ORAL | 0 refills | Status: DC | PRN

## 2022-09-29 MED ORDER — SODIUM CHLORIDE 0.9 % IV BOLUS
1000.0000 mL | Freq: Once | INTRAVENOUS | Status: AC
  Administered 2022-09-29: 1000 mL via INTRAVENOUS

## 2022-09-29 MED ORDER — KETOROLAC TROMETHAMINE 30 MG/ML IJ SOLN
30.0000 mg | Freq: Once | INTRAMUSCULAR | Status: AC
  Administered 2022-09-29: 30 mg via INTRAVENOUS
  Filled 2022-09-29: qty 1

## 2022-09-29 MED ORDER — ONDANSETRON HCL 4 MG/2ML IJ SOLN
4.0000 mg | Freq: Once | INTRAMUSCULAR | Status: AC
  Administered 2022-09-29: 4 mg via INTRAVENOUS
  Filled 2022-09-29: qty 2

## 2022-09-29 NOTE — ED Triage Notes (Signed)
Pt to ED via [facility] for right flank pain and nausea and vomiting. Pt was given 200 mcg fentanyl and 4 mg Zofran by EMS PTA. Pt appears pale on arrival.

## 2022-09-29 NOTE — ED Provider Notes (Signed)
[facility] Provider Note    Event Date/Time   First MD Initiated Contact with Patient 09/29/22 [phone]     (approximate)  History   Chief Complaint: Flank Pain  HPI  Samuel Fields is a 26 y.o. male with no past medical history presents to the emergency department for acute onset of right flank pain.  According to the patient approximately 2 hours ago he developed 10 out of 10 sharp severe pain to his right flank.  Patient states he has not urinated so does not know if he is having any hematuria or dysuria.  Denies any chest pain.  Positive for nausea and vomiting.  Received 200 mcg of fentanyl by EMS but continues to have 8/10 pain to the right flank.  Appears uncomfortable on my examination.  Physical Exam   Triage Vital Signs: ED Triage Vitals  Enc Vitals Group     BP 09/29/22 0722 (!) 127/94     Pulse Rate 09/29/22 0722 66     Resp 09/29/22 0722 16     Temp 09/29/22 0722 97.6 F (36.4 C)     Temp Source 09/29/22 0722 Oral     SpO2 09/29/22 0713 98 %     Weight 09/29/22 0718 240 lb (108.9 kg)     Height 09/29/22 0718 5\' 10"  (1.778 m)     Head Circumference --      Peak Flow --      Pain Score 09/29/22 0718 7     Pain Loc --      Pain Edu? --      Excl. in GC? --     Most recent vital signs: Vitals:   09/29/22 0713 09/29/22 0722  BP:  (!) 127/94  Pulse:  66  Resp:  16  Temp:  97.6 F (36.4 C)  SpO2: 98% 97%    General: Awake, mild distress due to pain and nausea.  Holding right flank. CV:  Good peripheral perfusion.  Regular rate and rhythm  Resp:  Normal effort.  Equal breath sounds bilaterally.  Abd:  No distention.  Soft, light right mid abdominal tenderness to palpation without rebound or guarding.   ED Results / Procedures / Treatments   RADIOLOGY  I reviewed and interpreted the CT images.  Patient appears to have a very proximal right ureteral stone. Radiology has read the CT scan is an obstructing 4 mm proximal right ureteral  stone.   MEDICATIONS ORDERED IN ED: Medications  ketorolac (TORADOL) 30 MG/ML injection 30 mg (has no administration in time range)  sodium chloride 0.9 % bolus 1,000 mL (has no administration in time range)  ondansetron (ZOFRAN) injection 4 mg (has no administration in time range)     IMPRESSION / MDM / ASSESSMENT AND PLAN / ED COURSE  I reviewed the triage vital signs and the nursing notes.  Patient's presentation is most consistent with acute presentation with potential threat to life or bodily function.  Patient presents to the emergency department for right flank pain starting 2 hours ago positive for nausea and vomiting as well.  Overall the patient appears to be in mild distress due to pain/discomfort.  Patient has received 200 mcg of fentanyl prior to arrival.  We will dose 30 mg of Toradol, Zofran, IV fluids.  We will check labs, urinalysis and obtain a CT renal scan.  Very high in the differential would be ureterolithiasis, lower on the differential would be pyelonephritis, UTI, cholecystitis, appendicitis diverticulitis or colitis.  CT confirms right proximal ureteral stone.  Patient states his pain is gone now after Toradol.  Patient's lab work shows a normal CBC with a normal white blood cell count, chemistry shows mild LFT elevation otherwise reassuring chemistry.  Patient's urinalysis shows greater than 50 red cells but no white cells.  Urine culture has been sent as a precaution although we will hold off on antibiotics.  As the patient's pain is controlled we will discharge with Toradol Percocet Zofran and Flomax.  We will discharge with a urine strainer have the patient follow-up with urology.  I discussed my typical kidney stone return precautions with the patient.  FINAL CLINICAL IMPRESSION(S) / ED DIAGNOSES   Right flank pain Kidney stone    Note:  This document was prepared using Dragon voice recognition software and may include unintentional dictation errors.    Minna Antis, MD 09/29/22 [phone]

## 2022-09-30 LAB — URINE CULTURE: Culture: <10000 — AB

## 2022-10-04 ENCOUNTER — Ambulatory Visit: Payer: BLUE CROSS/BLUE SHIELD | Admitting: Primary Care

## 2022-10-07 ENCOUNTER — Telehealth: Payer: BLUE CROSS/BLUE SHIELD | Admitting: Family Medicine

## 2022-10-07 DIAGNOSIS — N200 Calculus of kidney: Secondary | ICD-10-CM | POA: Diagnosis not present

## 2022-10-07 DIAGNOSIS — A084 Viral intestinal infection, unspecified: Secondary | ICD-10-CM | POA: Diagnosis not present

## 2022-10-07 NOTE — Patient Instructions (Signed)
Kidney Stones  Kidney stones are solid, rock-like deposits that form inside of the kidneys. The kidneys are a pair of organs that make urine. A kidney stone may form in a kidney and move into other parts of the urinary tract, including the tubes that connect the kidneys to the bladder (ureters), the bladder, and the tube that carries urine out of the body (urethra). As the stone moves through these areas, it can cause intense pain and block the flow of urine. Kidney stones are created when high levels of certain minerals are found in the urine. The stones are usually passed out of the body through urination, but in some cases, medical treatment may be needed to remove them. What are the causes? Kidney stones may be caused by: A condition in which certain glands produce too much parathyroid hormone (primary hyperparathyroidism), which causes too much calcium buildup in the blood. A buildup of uric acid crystals in the bladder (hyperuricosuria). Uric acid is a chemical that the body produces when you eat certain foods. It usually leaves the body in the urine. Narrowing (stricture) of one or both of the ureters. A kidney blockage that is present at birth (congenital obstruction). Past surgery on the kidney or the ureters. What increases the risk? The following factors may make you more likely to develop this condition: Having had a kidney stone in the past. Having a family history of kidney stones. Not drinking enough water. Eating a diet that is high in protein, salt (sodium), or sugar. Being overweight or obese. What are the signs or symptoms? Symptoms of a kidney stone may include: Pain in the side of the abdomen, right below the ribs (flank pain). Pain usually spreads (radiates) to the groin. Needing to urinate often or urgently. Painful urination. Blood in the urine (hematuria). Nausea. Vomiting. Fever and chills. How is this diagnosed? This condition may be diagnosed based on: Your  symptoms and medical history. A physical exam. Blood tests. Urine tests. These may be done before and after the stone passes out of your body through urination. Imaging tests, such as a CT scan, abdominal X-ray, or ultrasound. A procedure to examine the inside of the bladder (cystoscopy). How is this treated? Treatment for kidney stones depends on the size, location, and makeup of the stones. Kidney stones will often pass out of the body through urination. You may need to: Increase your fluid intake to help pass the stone. In some cases, you may be given fluids through an IV and may need to be monitored in the hospital. Take medicine for pain. Make changes in your diet to help prevent kidney stones from coming back. Sometimes, procedures are needed to remove a kidney stone. This may involve: A procedure to break up kidney stones using: A focused beam of light (laser therapy). Shock waves (extracorporeal shock wave lithotripsy). Surgery to remove kidney stones. This may be needed if you have severe pain or have stones that block your urinary tract. Follow these instructions at home: Medicines Take over-the-counter and prescription medicines only as told by your health care provider. Ask your health care provider if the medicine prescribed to you requires you to avoid driving or using heavy machinery. Eating and drinking Drink enough fluid to keep your urine pale yellow. You may be instructed to drink at least 8-10 glasses of water each day. This will help you pass the kidney stone. If directed, change your diet. This may include: Limiting how much sodium you eat. Eating more fruits   and vegetables. Limiting how much animal protein you eat. Animal proteins include red meat, poultry, fish, and eggs. Eating a normal amount of calcium (1,000-1,300 mg per day). Follow instructions from your health care provider about eating or drinking restrictions. General instructions Collect urine samples as  told by your health care provider. You may need to collect a urine sample: 24 hours after you pass the stone. 8-12 weeks after you pass the kidney stone, and every 6-12 months after that. Strain your urine every time you urinate, for as long as directed. Use the strainer that your health care provider recommends. Do not throw out the kidney stone after passing it. Keep the stone so it can be tested by your health care provider. Testing the makeup of your kidney stone may help prevent you from getting kidney stones in the future. Keep all follow-up visits. You may need follow-up X-rays or ultrasounds to make sure that your stone has passed. How is this prevented? To prevent another kidney stone: Drink enough fluid to keep your urine pale yellow. This is the best way to prevent kidney stones. Eat a healthy diet. Follow recommendations from your health care provider about foods to avoid. Recommendations vary depending on the type of kidney stone that you have. You may be instructed to eat a low-protein diet. Maintain a healthy weight. Where to find more information National Kidney Foundation (NKF): www.kidney.org [facility]): www.urologyhealth.org Contact a health care provider if: You have pain that gets worse or does not get better with medicine. Get help right away if: You have a fever or chills. You develop severe pain. You develop new abdominal pain. You faint. You are unable to urinate. Summary Kidney stones are solid, rock-like deposits that form inside of the kidneys. Kidney stones can cause nausea, vomiting, blood in the urine, abdominal pain, and the urge to urinate often. Treatment for kidney stones depends on the size, location, and makeup of the stones. Kidney stones will often pass out of the body through urination. Kidney stones can be prevented by drinking enough fluids, eating a healthy diet, and maintaining a healthy weight. This information is not intended  to replace advice given to you by your health care provider. Make sure you discuss any questions you have with your health care provider. Document Revised: 01/19/2022 Document Reviewed: 01/19/2022 Elsevier Patient Education  2023 [employer] Viral Gastroenteritis, Adult  Viral gastroenteritis is also known as the stomach flu. This condition may affect your stomach, your small intestine, and your large intestine. It can cause sudden watery poop (diarrhea), fever, and vomiting. This condition is caused by certain germs (viruses). These germs can be passed from person to person very easily (are contagious). Having watery poop and vomiting can make you feel weak and cause you to not have enough water in your body (get dehydrated). This can make you tired and thirsty, make you have a dry mouth, and make it so you pee (urinate) less often. It is important to replace the fluids that you lose from having watery poop and vomiting. What are the causes? You can get sick by catching germs from other people. You can also get sick by: Eating food, drinking water, or touching a surface that has the germs on it (is contaminated). Sharing utensils or other personal items with a person who is sick. What increases the risk? Having a weak body defense system (immune system). Living with one or more children who are younger than 2 years. Living in  a nursing home. Going on cruise ships. What are the signs or symptoms? Symptoms of this condition start suddenly. Symptoms may last for a few days or for as long as a week. Common symptoms include: Watery poop. Vomiting. Other symptoms include: Fever. Headache. Feeling tired (fatigue). Pain in the belly (abdomen). Chills. Feeling weak. Feeling like you may vomit (nauseous). Muscle aches. Not feeling hungry. How is this treated? This condition typically goes away on its own. The focus of treatment is to replace the fluids that you lose. This condition may be  treated with: An ORS (oral rehydration solution). This is a drink that helps you replace fluids and minerals your body lost. It is sold at pharmacies and stores. Medicines to help with your symptoms. Probiotic supplements to reduce symptoms of watery poop. Fluids given through an IV tube, if needed. Older adults and people with other diseases or a weak body defense system are at higher risk for not having enough water in the body. Follow these instructions at home: Eating and drinking  Take an ORS as told by your doctor. Drink clear fluids in small amounts as you are able. Clear fluids include: Water. Ice chips. Fruit juice that has water added to it (is diluted). Low-calorie sports drinks. Drink enough fluid to keep your pee (urine) pale yellow. Eat small amounts of healthy foods every 3-4 hours as you are able. This may include whole grains, fruits, vegetables, lean meats, and yogurt. Avoid fluids that have a lot of sugar or caffeine in them. This includes energy drinks, sports drinks, and soda. Avoid spicy or fatty foods. Avoid alcohol. General instructions  Wash your hands often. This is very important after you have watery poop or you vomit. If you cannot use soap and water, use hand sanitizer. Make sure that all people in your home wash their hands well and often. Take over-the-counter and prescription medicines only as told by your doctor. Rest at home while you get better. Watch your condition for any changes. Take a warm bath to help with any burning or pain from having watery poop. Keep all follow-up visits. Contact a doctor if: You cannot keep fluids down. Your symptoms get worse. You have new symptoms. You feel light-headed or dizzy. You have muscle cramps. Get help right away if: You have chest pain. You have trouble breathing, or you are breathing very fast. You have a fast heartbeat. You feel very weak or you faint. You have a very bad headache, a stiff neck, or  both. You have a rash. You have very bad pain, cramping, or bloating in your belly. Your skin feels cold and clammy. You feel mixed up (confused). You have pain when you pee. You have signs of not having enough water in the body, such as: Dark pee, hardly any pee, or no pee. Cracked lips. Dry mouth. Sunken eyes. Feeling very sleepy. Feeling weak. You have signs of bleeding, such as: You see blood in your vomit. Your vomit looks like coffee grounds. You have bloody or black poop or poop that looks like tar. These symptoms may be an emergency. Get help right away. Call 911. Do not wait to see if the symptoms will go away. Do not drive yourself to the hospital. Summary Viral gastroenteritis is also known as the stomach flu. This condition can cause sudden watery poop (diarrhea), fever, and vomiting. These germs can be passed from person to person very easily. Take an ORS (oral rehydration solution) as told by your doctor.  This is a drink that is sold at pharmacies and stores. Wash your hands often, especially after having watery poop or vomiting. If you cannot use soap and water, use hand sanitizer. This information is not intended to replace advice given to you by your health care provider. Make sure you discuss any questions you have with your health care provider. Document Revised: 08/09/2021 Document Reviewed: 08/09/2021 Elsevier Patient Education  2023 [employer].

## 2022-10-07 NOTE — Progress Notes (Signed)
Virtual Visit Consent   Samuel Fields, you are scheduled for a virtual visit with a [facility] provider today. Just as with appointments in the office, your consent must be obtained to participate. Your consent will be active for this visit and any virtual visit you may have with one of our providers in the next 365 days. If you have a MyChart account, a copy of this consent can be sent to you electronically.  As this is a virtual visit, video technology does not allow for your provider to perform a traditional examination. This may limit your provider's ability to fully assess your condition. If your provider identifies any concerns that need to be evaluated in person or the need to arrange testing (such as labs, EKG, etc.), we will make arrangements to do so. Although advances in technology are sophisticated, we cannot ensure that it will always work on either your end or our end. If the connection with a video visit is poor, the visit may have to be switched to a telephone visit. With either a video or telephone visit, we are not always able to ensure that we have a secure connection.  By engaging in this virtual visit, you consent to the provision of healthcare and authorize for your insurance to be billed (if applicable) for the services provided during this visit. Depending on your insurance coverage, you may receive a charge related to this service.  I need to obtain your verbal consent now. Are you willing to proceed with your visit today? Samuel Fields has provided verbal consent on 10/07/2022 for a virtual visit (video or telephone). Georgana Curio, FNP  Date: 10/07/2022 8:06 AM  Virtual Visit via Video Note   I, Georgana Curio, connected with  Samuel Fields  ([id number], 1996/04/26) on 10/07/22 at  8:00 AM EST by a video-enabled telemedicine application and verified that I am speaking with the correct person using two identifiers.  Location: Patient: Virtual Visit Location Patient:  Home Provider: Virtual Visit Location Provider: Home Office   I discussed the limitations of evaluation and management by telemedicine and the availability of in person appointments. The patient expressed understanding and agreed to proceed.    History of Present Illness: Samuel Fields is a 26 y.o. who identifies as a male who was assigned male at birth, and is being seen today for kidney stones for a week and a stomach virus with nausea and diarrhea yesterday. GI virus has improved. He will see pcp for kidney stones if needed. No fever. He is requesting a work note for yesterday and today however I have told him we could only provide one for today.   HPI: HPI  Problems:  Patient Active Problem List   Diagnosis Date Noted   Preventative health care 12/07/2021   Family history of early CAD 12/07/2021   Acute foot pain, right 11/30/2021   Chronic pain in left foot 10/29/2018   Hyperlipidemia 07/05/2017   Elevated blood pressure reading 07/05/2017   Skin tag 07/05/2017    Allergies: No Known Allergies Medications:  Current Outpatient Medications:    amoxicillin (AMOXIL) 875 MG tablet, Take 1 tablet (875 mg total) by mouth 2 (two) times daily. 1 po BID (Patient not taking: Reported on 09/29/2022), Disp: 14 tablet, Rfl: 0   fluticasone (FLONASE) 50 MCG/ACT nasal spray, Place 2 sprays into both nostrils daily., Disp: 16 g, Rfl: 0   ketorolac (TORADOL) 10 MG tablet, Take 1 tablet (10 mg total) by mouth every  6 (six) hours as needed., Disp: 20 tablet, Rfl: 0   meloxicam (MOBIC) 15 MG tablet, Take 1 tablet (15 mg total) by mouth daily as needed for pain. (Patient not taking: Reported on 09/29/2022), Disp: 30 tablet, Rfl: 0   ondansetron (ZOFRAN) 4 MG tablet, Take 1 tablet (4 mg total) by mouth every 8 (eight) hours as needed for nausea or vomiting. (Patient not taking: Reported on 09/29/2022), Disp: 20 tablet, Rfl: 0   ondansetron (ZOFRAN-ODT) 4 MG disintegrating tablet, Take 1 tablet (4 mg total)  by mouth every 8 (eight) hours as needed for nausea or vomiting., Disp: 20 tablet, Rfl: 0   oxyCODONE-acetaminophen (PERCOCET) 5-325 MG tablet, Take 1 tablet by mouth every 4 (four) hours as needed for severe pain., Disp: 12 tablet, Rfl: 0   predniSONE (DELTASONE) 10 MG tablet, Directions for 6 day taper: Day 1: 2 tablets before breakfast, 1 after both lunch & dinner and 2 at bedtime Day 2: 1 tab before breakfast, 1 after both lunch & dinner and 2 at bedtime Day 3: 1 tab at each meal & 1 at bedtime Day 4: 1 tab at breakfast, 1 at lunch, 1 at bedtime Day 5: 1 tab at breakfast & 1 tab at bedtime Day 6: 1 tab at breakfast (Patient not taking: Reported on 09/29/2022), Disp: 21 tablet, Rfl: 0   tamsulosin (FLOMAX) 0.4 MG CAPS capsule, Take 1 capsule (0.4 mg total) by mouth daily., Disp: 30 capsule, Rfl: 0  Observations/Objective: Patient is well-developed, well-nourished in no acute distress.  Resting comfortably  at home.  Head is normocephalic, atraumatic.  No labored breathing.  Speech is clear and coherent with logical content.  Patient is alert and oriented at baseline.    Assessment and Plan: 1. Viral gastroenteritis  2. Kidney stone  Increase fluids, no milk or dairy, proceed to urgent care or pcp if sx worsen or persist.   Follow Up Instructions: I discussed the assessment and treatment plan with the patient. The patient was provided an opportunity to ask questions and all were answered. The patient agreed with the plan and demonstrated an understanding of the instructions.  A copy of instructions were sent to the patient via MyChart unless otherwise noted below.     The patient was advised to call back or seek an in-person evaluation if the symptoms worsen or if the condition fails to improve as anticipated.  Time:  I spent 10 minutes with the patient via telehealth technology discussing the above problems/concerns.    Georgana Curio, FNP

## 2022-10-13 ENCOUNTER — Telehealth: Payer: BLUE CROSS/BLUE SHIELD | Admitting: Primary Care

## 2022-11-21 ENCOUNTER — Telehealth: Payer: BLUE CROSS/BLUE SHIELD | Admitting: Physician Assistant

## 2022-11-21 DIAGNOSIS — R109 Unspecified abdominal pain: Secondary | ICD-10-CM | POA: Diagnosis not present

## 2022-11-21 NOTE — Progress Notes (Signed)
Virtual Visit Consent   Samuel Fields, you are scheduled for a virtual visit with a [facility] provider today. Just as with appointments in the office, your consent must be obtained to participate. Your consent will be active for this visit and any virtual visit you may have with one of our providers in the next 365 days. If you have a MyChart account, a copy of this consent can be sent to you electronically.  As this is a virtual visit, video technology does not allow for your provider to perform a traditional examination. This may limit your provider's ability to fully assess your condition. If your provider identifies any concerns that need to be evaluated in person or the need to arrange testing (such as labs, EKG, etc.), we will make arrangements to do so. Although advances in technology are sophisticated, we cannot ensure that it will always work on either your end or our end. If the connection with a video visit is poor, the visit may have to be switched to a telephone visit. With either a video or telephone visit, we are not always able to ensure that we have a secure connection.  By engaging in this virtual visit, you consent to the provision of healthcare and authorize for your insurance to be billed (if applicable) for the services provided during this visit. Depending on your insurance coverage, you may receive a charge related to this service.  I need to obtain your verbal consent now. Are you willing to proceed with your visit today? Samuel Fields has provided verbal consent on 11/21/2022 for a virtual visit (video or telephone). Leeanne Rio, [place]  Date: 11/21/2022 11:35 AM  Virtual Visit via Video Note   I, Leeanne Rio, connected with  Samuel Fields  ([id number], 1996-05-20) on 11/21/22 at 11:15 AM EST by a video-enabled telemedicine application and verified that I am speaking with the correct person using two identifiers.  Location: Patient: Virtual Visit Location  Patient: Home Provider: Virtual Visit Location Provider: Home Office   I discussed the limitations of evaluation and management by telemedicine and the availability of in person appointments. The patient expressed understanding and agreed to proceed.    History of Present Illness: Samuel Fields is a 27 y.o. who identifies as a male who was assigned male at birth, and is being seen today for concern of pain from kidney stones. Notes he woke up this AM with R flank pain. Has a known 4 mm stone on imaging in December that was causing some obstruction at that time. Was given narcotic pain medication and Tamsulosin to take for medical expulsion therapy. No issue since diagnosed at ER in December. Does not feel he passed the stones.  Denies fever, chills. Denies change it urinary habits. Denies hematuria. *  HPI: HPI  Problems:  Patient Active Problem List   Diagnosis Date Noted   Preventative health care 12/07/2021   Family history of early CAD 12/07/2021   Acute foot pain, right 11/30/2021   Chronic pain in left foot 10/29/2018   Hyperlipidemia 07/05/2017   Elevated blood pressure reading 07/05/2017   Skin tag 07/05/2017    Allergies: No Known Allergies Medications:  Current Outpatient Medications:    ketorolac (TORADOL) 10 MG tablet, Take 1 tablet (10 mg total) by mouth every 6 (six) hours as needed., Disp: 20 tablet, Rfl: 0   ondansetron (ZOFRAN) 4 MG tablet, Take 1 tablet (4 mg total) by mouth every 8 (eight) hours as  needed for nausea or vomiting. (Patient not taking: Reported on 09/29/2022), Disp: 20 tablet, Rfl: 0   ondansetron (ZOFRAN-ODT) 4 MG disintegrating tablet, Take 1 tablet (4 mg total) by mouth every 8 (eight) hours as needed for nausea or vomiting., Disp: 20 tablet, Rfl: 0   oxyCODONE-acetaminophen (PERCOCET) 5-325 MG tablet, Take 1 tablet by mouth every 4 (four) hours as needed for severe pain., Disp: 12 tablet, Rfl: 0   tamsulosin (FLOMAX) 0.4 MG CAPS capsule, Take 1 capsule  (0.4 mg total) by mouth daily., Disp: 30 capsule, Rfl: 0  Observations/Objective: Patient is well-developed, well-nourished in no acute distress.  Resting comfortably at home.  Head is normocephalic, atraumatic.  No labored breathing. Speech is clear and coherent with logical content.  Patient is alert and oriented at baseline.    Assessment and Plan: 1. Right flank pain  Needs in person evaluation. This very well could be the same stone that was never fully passed, or could potentially be new stone or alternative etiology. Does not have a urologist. Samuel Fields provide needed analgesia through video visit. He is to seek in person evaluation with PCP first. If unavailable, UC. Strict ER precautions reviewed.   Follow Up Instructions: I discussed the assessment and treatment plan with the patient. The patient was provided an opportunity to ask questions and all were answered. The patient agreed with the plan and demonstrated an understanding of the instructions.  A copy of instructions were sent to the patient via MyChart unless otherwise noted below.   The patient was advised to call back or seek an in-person evaluation if the symptoms worsen or if the condition fails to improve as anticipated.  Time:  I spent 10 minutes with the patient via telehealth technology discussing the above problems/concerns.    Leeanne Rio, PA-C

## 2022-11-21 NOTE — Patient Instructions (Signed)
  Samuel Fields, thank you for joining Leeanne Rio, PA-C for today's virtual visit.  While this provider is not your primary care provider (PCP), if your PCP is located in our provider database this encounter information will be shared with them immediately following your visit.   [facility] account gives you access to today's visit and all your visits, tests, and labs performed at [facility] " click here if you don't have a [facility] account or go to [email or web address]  Consent: (Patient) Samuel Fields provided verbal consent for this virtual visit at the beginning of the encounter.  Current Medications:  Current Outpatient Medications:    ketorolac (TORADOL) 10 MG tablet, Take 1 tablet (10 mg total) by mouth every 6 (six) hours as needed., Disp: 20 tablet, Rfl: 0   ondansetron (ZOFRAN) 4 MG tablet, Take 1 tablet (4 mg total) by mouth every 8 (eight) hours as needed for nausea or vomiting. (Patient not taking: Reported on 09/29/2022), Disp: 20 tablet, Rfl: 0   ondansetron (ZOFRAN-ODT) 4 MG disintegrating tablet, Take 1 tablet (4 mg total) by mouth every 8 (eight) hours as needed for nausea or vomiting., Disp: 20 tablet, Rfl: 0   oxyCODONE-acetaminophen (PERCOCET) 5-325 MG tablet, Take 1 tablet by mouth every 4 (four) hours as needed for severe pain., Disp: 12 tablet, Rfl: 0   tamsulosin (FLOMAX) 0.4 MG CAPS capsule, Take 1 capsule (0.4 mg total) by mouth daily., Disp: 30 capsule, Rfl: 0   Medications ordered in this encounter:  No orders of the defined types were placed in this encounter.    *If you need refills on other medications prior to your next appointment, please contact your pharmacy*  Follow-Up: Call back or seek an in-person evaluation if the symptoms worsen or if the condition fails to improve as anticipated.  [facility] [phone]  Other Instructions  If you have been instructed to have an in-person  evaluation today at a local Urgent Care facility, please use the link below. It will take you to a list of all of our available [facility], including address, phone number and hours of operation. Please do not delay care.  [facility]  If you or a family member do not have a primary care provider, use the link below to schedule a visit and establish care. When you choose a [facility] primary care physician or advanced practice provider, you gain a long-term partner in health. Find a Primary Care Provider  Learn more about [facility]'s in-office and virtual care options: [facility]

## 2022-12-13 ENCOUNTER — Encounter: Payer: BLUE CROSS/BLUE SHIELD | Admitting: Primary Care

## 2022-12-13 ENCOUNTER — Encounter: Payer: Self-pay | Admitting: Primary Care

## 2023-01-08 ENCOUNTER — Emergency Department (HOSPITAL_COMMUNITY)
Admission: EM | Admit: 2023-01-08 | Discharge: 2023-01-08 | Disposition: A | Discharge status: Home / Self Care | Payer: BLUE CROSS/BLUE SHIELD | Attending: Emergency Medicine | Admitting: Emergency Medicine

## 2023-01-08 ENCOUNTER — Other Ambulatory Visit: Payer: Self-pay

## 2023-01-08 ENCOUNTER — Emergency Department (HOSPITAL_COMMUNITY): Payer: BLUE CROSS/BLUE SHIELD

## 2023-01-08 DIAGNOSIS — M79672 Pain in left foot: Secondary | ICD-10-CM | POA: Diagnosis present

## 2023-01-08 MED ORDER — PREDNISONE 10 MG PO TABS: 20.0000 mg | ORAL_TABLET | Freq: Two times a day (BID) | ORAL | 0 refills | Status: DC

## 2023-01-08 MED ORDER — MELOXICAM 7.5 MG PO TABS: 7.5000 mg | ORAL_TABLET | Freq: Every day | ORAL | 0 refills | Status: DC

## 2023-01-08 NOTE — ED Triage Notes (Signed)
Pt arrived via POV. Pt c/o L foot pain d/t plantar fasciitis. Pt has been out of the meds they use to treat it.  AOx4

## 2023-01-08 NOTE — ED Provider Notes (Signed)
[facility] DEPARTMENT AT [facility] Provider Note   CSN: [id number] Arrival date & time: 01/08/23  1200     History  Chief Complaint  Patient presents with   Foot Pain    Samuel Fields is a 27 y.o. male.  With history of chronic left foot pain who presents to the ED for evaluation of left foot pain.  States he gets this pain intermittently which has been present over the past 5 years.  Typically treated with meloxicam and a steroid burst pack with full resolution.  States the pain began to get worse yesterday.  He denies any new injury.  Denies fevers, numbness, weakness, tingling.  He is still able to ambulate but with pain.  Believes it is secondary to plantar fasciitis.  Denies history of diabetes or gastric ulcers.   Foot Pain       Home Medications Prior to Admission medications   Medication Sig Start Date End Date Taking? Authorizing Provider  meloxicam (MOBIC) 7.5 MG tablet Take 1 tablet (7.5 mg total) by mouth daily. 01/08/23  Yes Brennen Gardiner, [place], PA-C  predniSONE (DELTASONE) 10 MG tablet Take 2 tablets (20 mg total) by mouth 2 (two) times daily with a meal. 01/08/23  Yes Kishan Wachsmuth, [place], PA-C  ketorolac (TORADOL) 10 MG tablet Take 1 tablet (10 mg total) by mouth every 6 (six) hours as needed. 09/29/22   Harvest Dark, MD  ondansetron (ZOFRAN) 4 MG tablet Take 1 tablet (4 mg total) by mouth every 8 (eight) hours as needed for nausea or vomiting. Patient not taking: Reported on 09/29/2022 06/09/22   Couture, Cortni S, PA-C  ondansetron (ZOFRAN-ODT) 4 MG disintegrating tablet Take 1 tablet (4 mg total) by mouth every 8 (eight) hours as needed for nausea or vomiting. 09/29/22   Harvest Dark, MD  oxyCODONE-acetaminophen (PERCOCET) 5-325 MG tablet Take 1 tablet by mouth every 4 (four) hours as needed for severe pain. 09/29/22   Harvest Dark, MD  tamsulosin (FLOMAX) 0.4 MG CAPS capsule Take 1 capsule (0.4 mg total) by mouth daily. 09/29/22    Harvest Dark, MD      Allergies    Patient has no known allergies.    Review of Systems   Review of Systems  Musculoskeletal:  Positive for arthralgias.  All other systems reviewed and are negative.   Physical Exam Updated Vital Signs BP (!) 165/112 (BP Location: Right Arm)   Pulse 99   Temp 97.8 F (36.6 C) (Oral)   Resp 16   SpO2 99%  Physical Exam Vitals and nursing note reviewed.  Constitutional:      General: He is not in acute distress.    Appearance: Normal appearance. He is normal weight. He is not ill-appearing.  HENT:     Head: Normocephalic and atraumatic.  Cardiovascular:     Comments: DP pulses 2+ Pulmonary:     Effort: Pulmonary effort is normal. No respiratory distress.  Abdominal:     General: Abdomen is flat.  Musculoskeletal:        General: Normal range of motion.     Cervical back: Neck supple.     Comments: No swelling or TTP of the left foot, ankle, calf, knee.  He was ambulatory in the ED  Skin:    General: Skin is warm and dry.     Comments: No erythema or warmth to the left foot  Neurological:     Mental Status: He is alert and oriented to person, place,  and time.  Psychiatric:        Mood and Affect: Mood normal.        Behavior: Behavior normal.     ED Results / Procedures / Treatments   Labs (all labs ordered are listed, but only abnormal results are displayed) Labs Reviewed - No data to display  EKG None  Radiology DG Foot Complete Left  Result Date: 01/08/2023 CLINICAL DATA:  Pt c/o L foot pain d/t plantar fasciitis, swelling and pain to left foot onset last night. foot pain EXAM: LEFT FOOT - COMPLETE 3+ VIEW COMPARISON:  None Available. FINDINGS: No fracture or dislocation of mid foot or forefoot. The phalanges are normal. The calcaneus is normal. No soft tissue abnormality. IMPRESSION: No acute osseous abnormality. Electronically Signed   By: Suzy Bouchard M.D.   On: 01/08/2023 13:17    Procedures Procedures     Medications Ordered in ED Medications - No data to display  ED Course/ Medical Decision Making/ A&P                             Medical Decision Making Amount and/or Complexity of Data Reviewed Radiology: ordered.  Risk Prescription drug management.  This patient presents to the ED for concern of atraumatic left foot pain, this involves an extensive number of treatment options, and is a complaint that carries with it a high risk of complications and morbidity.  The differential diagnosis includes fracture, strain, sprain, tendinitis, arthritis, gout, pseudogout  Co morbidities that complicate the patient evaluation   chronic left foot pain  My initial workup includes x-ray left foot  Additional history obtained from: Nursing notes from this visit.  I ordered imaging studies including x-ray left foot I independently visualized and interpreted imaging which showed no acute osseous abnormalities I agree with the radiologist interpretation  Afebrile, hemodynamically stable.  27 year old male presenting to the ED for evaluation of atraumatic left foot pain.  Physical exam is completely benign.  X-ray imaging negative.  Low suspicion for acute osseous abnormalities.  Also low suspicion for infectious etiology.  Patient will be prescribed meloxicam and prednisone burst pack for his symptoms and encouraged to follow-up with his primary care provider.  He was given return precautions.  Stable at discharge.  At this time there does not appear to be any evidence of an acute emergency medical condition and the patient appears stable for discharge with appropriate outpatient follow up. Diagnosis was discussed with patient who verbalizes understanding of care plan and is agreeable to discharge. I have discussed return precautions with patient who verbalizes understanding. Patient encouraged to follow-up with their PCP within 1 week. All questions answered.  Note: Portions of this report may  have been transcribed using voice recognition software. Every effort was made to ensure accuracy; however, inadvertent computerized transcription errors may still be present.        Final Clinical Impression(s) / ED Diagnoses Final diagnoses:  Foot pain, left    Rx / DC Orders ED Discharge Orders          Ordered    meloxicam (MOBIC) 7.5 MG tablet  Daily        01/08/23 1347    predniSONE (DELTASONE) 10 MG tablet  2 times daily with meals        01/08/23 1347              Roylene Reason, PA-C 01/08/23 1414    Steinl,  Lennette Bihari, MD 01/08/23 [phone]

## 2023-01-08 NOTE — Discharge Instructions (Addendum)
You have been seen today for your complaint of left foot pain. Your imaging was reassuring. Your discharge medications include meloxicam.  This is a pain medication.  Take it once daily until your symptoms resolve. Prednisone.  This is a steroid.  Take it as prescribed and for the entire duration of the prescription. Follow up with: Your primary care provider in 1 week for reevaluation of your symptoms Please seek immediate medical care if you develop any of the following symptoms: Your pain does not get better after a few days of treatment at home. Your pain gets worse. You cannot stand on your foot. Your foot or toes are swollen. Your foot is numb or tingling. At this time there does not appear to be the presence of an emergent medical condition, however there is always the potential for conditions to change. Please read and follow the below instructions.  Do not take your medicine if  develop an itchy rash, swelling in your mouth or lips, or difficulty breathing; call 911 and seek immediate emergency medical attention if this occurs.  You may review your lab tests and imaging results in their entirety on your MyChart account.  Please discuss all results of fully with your primary care provider and other specialist at your follow-up visit.  Note: Portions of this text may have been transcribed using voice recognition software. Every effort was made to ensure accuracy; however, inadvertent computerized transcription errors may still be present.

## 2023-01-08 NOTE — ED Provider Triage Note (Signed)
Emergency Medicine Provider Triage Evaluation Note  Samuel Fields , a 27 y.o. male  was evaluated in triage.  Pt complains of left dorsal foot pain.  Has been off and on for 5 years.  Typically treated with meloxicam and a steroid.  States he got worse yesterday.  Denies numbness or tingling.  Denies new trauma.  Review of Systems  Positive: As above Negative: As above  Physical Exam  BP (!) 165/112 (BP Location: Right Arm)   Pulse 99   Temp 97.8 F (36.6 C) (Oral)   Resp 16   SpO2 99%  Gen:   Awake, no distress   Resp:  Normal effort  MSK:   Moves extremities without difficulty  Other:    Medical Decision Making  Medically screening exam initiated at 12:46 PM.  Appropriate orders placed.  Samuel Fields was informed that the remainder of the evaluation will be completed by another provider, this initial triage assessment does not replace that evaluation, and the importance of remaining in the ED until their evaluation is complete.  X-ray ordered   Roylene Reason, PA-C 01/08/23 1247

## 2023-01-09 ENCOUNTER — Telehealth: Payer: Self-pay

## 2023-01-09 NOTE — Transitions of Care (Post Inpatient/ED Visit) (Signed)
   01/09/2023  Name: Samuel Fields MRN: [id number] DOB: August 21, 1996  Today's TOC FU Call Status: Today's TOC FU Call Status:: Successful TOC FU Call Competed TOC FU Call Complete Date: 01/09/23  Transition Care Management Follow-up Telephone Call Date of Discharge: 01/08/23 Discharge Facility: [facility]) Type of Discharge: Emergency Department Reason for ED Visit: Other: (foot pain) How have you been since you were released from the hospital?: Same Any questions or concerns?: No  Items Reviewed: Did you receive and understand the discharge instructions provided?: Yes Medications obtained and verified?: Yes (Medications Reviewed) Any new allergies since your discharge?: No Dietary orders reviewed?: NA Do you have support at home?: No  Home Care and Equipment/Supplies: [place] Ordered?: NA Any new equipment or medical supplies ordered?: NA  Functional Questionnaire: Do you need assistance with bathing/showering or dressing?: No Do you need assistance with meal preparation?: No Do you need assistance with eating?: No Do you have difficulty maintaining continence: No Do you need assistance with getting out of bed/getting out of a chair/moving?: No Do you have difficulty managing or taking your medications?: No  Follow up appointments reviewed: PCP Follow-up appointment confirmed?: No (Patient declined to make app) MD Provider Line Number:[phone] Given: Yes [facility] Follow-up appointment confirmed?: NA Do you need transportation to your follow-up appointment?: No Do you understand care options if your condition(s) worsen?: Yes-patient verbalized understanding    SIGNATURE.Francella Solian, CMA

## 2023-08-07 ENCOUNTER — Telehealth: Payer: Medicaid Other | Admitting: Physician Assistant

## 2023-08-07 DIAGNOSIS — H6992 Unspecified Eustachian tube disorder, left ear: Secondary | ICD-10-CM | POA: Diagnosis not present

## 2023-08-08 ENCOUNTER — Encounter: Payer: Self-pay | Admitting: Physician Assistant

## 2023-08-08 MED ORDER — IPRATROPIUM BROMIDE 0.03 % NA SOLN: 2.0000 | Freq: Two times a day (BID) | NASAL | 0 refills | Status: DC

## 2023-08-08 NOTE — Progress Notes (Signed)
Patient completed an EV overnight and had treatment sent in. Did not show for scheduled VV for same issue. Suspect erroneous duplicate encounter.  This encounter was created in error - please disregard.

## 2023-08-08 NOTE — Progress Notes (Signed)

## 2023-08-08 NOTE — Progress Notes (Signed)
I have spent 5 minutes in review of e-visit questionnaire, review and updating patient chart, medical decision making and response to patient.   Mia Milan Cody Jacklynn Dehaas, PA-C    

## 2023-10-17 ENCOUNTER — Other Ambulatory Visit: Payer: Self-pay

## 2023-10-17 ENCOUNTER — Emergency Department (HOSPITAL_COMMUNITY): Payer: Medicaid Other

## 2023-10-17 ENCOUNTER — Emergency Department (HOSPITAL_COMMUNITY)
Admission: EM | Admit: 2023-10-17 | Discharge: 2023-10-18 | Disposition: A | Discharge status: Home / Self Care | Payer: Medicaid Other | Attending: Emergency Medicine | Admitting: Emergency Medicine

## 2023-10-17 DIAGNOSIS — Z1152 Encounter for screening for COVID-19: Secondary | ICD-10-CM | POA: Insufficient documentation

## 2023-10-17 DIAGNOSIS — R7401 Elevation of levels of liver transaminase levels: Secondary | ICD-10-CM | POA: Diagnosis not present

## 2023-10-17 DIAGNOSIS — T50901A Poisoning by unspecified drugs, medicaments and biological substances, accidental (unintentional), initial encounter: Secondary | ICD-10-CM | POA: Insufficient documentation

## 2023-10-17 DIAGNOSIS — R000 Tachycardia, unspecified: Secondary | ICD-10-CM | POA: Diagnosis not present

## 2023-10-17 DIAGNOSIS — D72829 Elevated white blood cell count, unspecified: Secondary | ICD-10-CM | POA: Diagnosis not present

## 2023-10-17 LAB — COMPREHENSIVE METABOLIC PANEL
ALT: 69 U/L — ABNORMAL HIGH (ref 0–44)
AST: 33 U/L (ref 15–41)
Albumin: 4.5 g/dL (ref 3.5–5.0)
Alkaline Phosphatase: 55 U/L (ref 38–126)
Anion gap: 13 (ref 5–15)
BUN: 20 mg/dL (ref 6–20)
CO2: 22 mmol/L (ref 22–32)
Calcium: 9.3 mg/dL (ref 8.9–10.3)
Chloride: 103 mmol/L (ref 98–111)
Creatinine, Ser: 1.08 mg/dL (ref 0.61–1.24)
GFR, Estimated: >60 mL/min (ref >60)
Glucose, Bld: 124 mg/dL — ABNORMAL HIGH (ref 70–99)
Potassium: 3.7 mmol/L (ref 3.5–5.1)
Sodium: 138 mmol/L (ref 135–145)
Total Bilirubin: 0.6 mg/dL (ref <1.2)
Total Protein: 7.4 g/dL (ref 6.5–8.1)

## 2023-10-17 LAB — CBC WITH DIFFERENTIAL/PLATELET
Abs Immature Granulocytes: 0.1 10*3/uL — ABNORMAL HIGH (ref 0.00–0.07)
Basophils Absolute: 0.1 10*3/uL (ref 0.0–0.1)
Basophils Relative: 0 %
Eosinophils Absolute: 0.1 10*3/uL (ref 0.0–0.5)
Eosinophils Relative: 0 %
HCT: 43 % (ref 39.0–52.0)
Hemoglobin: 15.4 g/dL (ref 13.0–17.0)
Immature Granulocytes: 1 %
Lymphocytes Relative: 7 %
Lymphs Abs: 1.4 10*3/uL (ref 0.7–4.0)
MCH: 33.1 pg (ref 26.0–34.0)
MCHC: 35.8 g/dL (ref 30.0–36.0)
MCV: 92.5 fL (ref 80.0–100.0)
Monocytes Absolute: 1 10*3/uL (ref 0.1–1.0)
Monocytes Relative: 5 %
Neutro Abs: 17.5 10*3/uL — ABNORMAL HIGH (ref 1.7–7.7)
Neutrophils Relative %: 87 %
Platelets: 309 10*3/uL (ref 150–400)
RBC: 4.65 MIL/uL (ref 4.22–5.81)
RDW: 11.9 % (ref 11.5–15.5)
WBC: 20.2 10*3/uL — ABNORMAL HIGH (ref 4.0–10.5)
nRBC: 0 % (ref 0.0–0.2)

## 2023-10-17 LAB — RESP PANEL BY RT-PCR (RSV, FLU A&B, COVID)  RVPGX2
Influenza A by PCR: NEGATIVE
Influenza B by PCR: NEGATIVE
Resp Syncytial Virus by PCR: NEGATIVE
SARS Coronavirus 2 by RT PCR: NEGATIVE

## 2023-10-17 LAB — ETHANOL: Alcohol, Ethyl (B): <10 mg/dL (ref <10)

## 2023-10-17 NOTE — ED Triage Notes (Signed)
Pt from work where he took two puffs of a THC pen and had a syncopal episode. Pt has remained lethargic since the syncopal episode.

## 2023-10-17 NOTE — ED Notes (Signed)
This RN moved patient to a room in order to urinate in urinal. Patient stated he did not want to pee for a sample.

## 2023-10-17 NOTE — ED Provider Notes (Signed)
EMERGENCY DEPARTMENT AT [facility] Provider Note   CSN: [id number] Arrival date & time: 10/17/23  1735     History {Add pertinent medical, surgical, social history, OB history to HPI:1} Chief Complaint  Patient presents with   Drug Overdose    Samuel Fields is a 27 y.o. male with PMH as listed below who presents with THC ingestion.   Wife at bedside states that her husband's coworkers at [employer] called her to tell her that he passed out.  She sent her parents to go pick him up and he was altered and confused, kept saying "I am sorry, I am stupid." This has never happened to him before and he does not normally use drugs/EtOH.  On my specimen of the patient and he is GCS 12, moves all extremities, intermittently confused but is oriented to self and knows who his wife is.  He states that he did use a THC pen today, does not remember if he fell or hit his head, unsure about what else happened today.  He reports no head or neck pain, denies any pain elsewhere.  States he was otherwise in his normal state of health. He complains of feeling very thirsty and dehydrated.  Past Medical History:  Diagnosis Date   Allergy        Home Medications Prior to Admission medications   Medication Sig Start Date End Date Taking? Authorizing Provider  ibuprofen (ADVIL) 200 MG tablet Take 400 mg by mouth daily as needed for headache.   Yes [provider]  omeprazole (PRILOSEC OTC) 20 MG tablet Take 20 mg by mouth daily as needed (heartburn).   Yes [provider]      Allergies    Patient has no known allergies.    Review of Systems   Review of Systems A 10 point review of systems was performed and is negative unless otherwise reported in HPI.  Physical Exam Updated Vital Signs BP 132/74   Pulse 74   Temp 98 F (36.7 C)   Resp 20   Ht 5\' 10"  (1.778 m)   Wt 104.3 kg   SpO2 97%   BMI 33.00 kg/m  Physical Exam General: Normal appearing  male, lying in bed.  HEENT: NCAT.  PERRLA midrange, Sclera anicteric, trachea midline.  Tongue protrudes midline.  Dry mucous membranes.  No midline C-spine tenderness palpation step-offs or deformities. Cardiology: RRR, no murmurs/rubs/gallops.  Resp: Normal respiratory rate and effort. CTAB, no wheezes, rhonchi, crackles.  Abd: Soft, non-tender, non-distended. No rebound tenderness or guarding.  GU: Deferred. MSK: No peripheral edema or signs of trauma. Extremities without deformity or TTP. No cyanosis or clubbing. Skin: warm, dry. Back: No CVA tenderness Neuro: Lethargic, oriented to self and situation, CNs II-XII grossly intact.  5 out of 5 strength in all extremities.. Sensation grossly intact.  Slurred speech.  ED Results / Procedures / Treatments   Labs (all labs ordered are listed, but only abnormal results are displayed) Labs Reviewed  CBC WITH DIFFERENTIAL/PLATELET - Abnormal; Notable for the following components:      Result Value   WBC 20.2 (*)    Neutro Abs 17.5 (*)    Abs Immature Granulocytes 0.10 (*)    All other components within normal limits  COMPREHENSIVE METABOLIC PANEL - Abnormal; Notable for the following components:   Glucose, Bld 124 (*)    ALT 69 (*)    All other components within normal limits  RESP PANEL BY RT-PCR (  RSV, FLU A&B, COVID)  [id number]  ETHANOL    EKG EKG Interpretation Date/Time:  Tuesday October 17 2023 17:53:58 EST Ventricular Rate:  115 PR Interval:  164 QRS Duration:  84 QT Interval:  336 QTC Calculation: 464 R Axis:   129  Text Interpretation: Sinus tachycardia Right axis deviation Pulmonary disease pattern Confirmed by Vivi Barrack [phone]) on 10/17/2023 6:36:05 PM  Radiology CT Cervical Spine Wo Contrast Result Date: 10/17/2023 CLINICAL DATA:  Neck trauma, intoxicated or obtunded (Age >= 16y). Drug overdose. EXAM: CT CERVICAL SPINE WITHOUT CONTRAST TECHNIQUE: Multidetector CT imaging of the cervical spine was performed without  intravenous contrast. Multiplanar CT image reconstructions were also generated. RADIATION DOSE REDUCTION: This exam was performed according to the departmental dose-optimization program which includes automated exposure control, adjustment of the mA and/or kV according to patient size and/or use of iterative reconstruction technique. COMPARISON:  None Available. FINDINGS: Alignment: Normal Skull base and vertebrae: No acute fracture. No primary bone lesion or focal pathologic process. Soft tissues and spinal canal: No prevertebral fluid or swelling. No visible canal hematoma. Disc levels:  Normal Upper chest: Negative Other: None IMPRESSION: Normal study. Electronically Signed   By: Charlett Nose M.D.   On: 10/17/2023 19:36   CT Head Wo Contrast Result Date: 10/17/2023 CLINICAL DATA:  Mental status changes.  Drug overdose. EXAM: CT HEAD WITHOUT CONTRAST TECHNIQUE: Contiguous axial images were obtained from the base of the skull through the vertex without intravenous contrast. RADIATION DOSE REDUCTION: This exam was performed according to the departmental dose-optimization program which includes automated exposure control, adjustment of the mA and/or kV according to patient size and/or use of iterative reconstruction technique. COMPARISON:  None Available. FINDINGS: Brain: No acute intracranial abnormality. Specifically, no hemorrhage, hydrocephalus, mass lesion, acute infarction, or significant intracranial injury. Vascular: No hyperdense vessel or unexpected calcification. Skull: No acute calvarial abnormality. Sinuses/Orbits: No acute findings Other: None IMPRESSION: No acute intracranial abnormality. Electronically Signed   By: Charlett Nose M.D.   On: 10/17/2023 19:35   DG Chest 1 View Result Date: 10/17/2023 CLINICAL DATA:  Syncope and tachycardia. Leukocytosis. Altered mental status. EXAM: CHEST  1 VIEW COMPARISON:  None Available. FINDINGS: Shallow inspiration. No focal consolidation, pleural effusion,  or pneumothorax. The cardiac silhouette is within normal limits. No acute osseous pathology. IMPRESSION: No active disease. Electronically Signed   By: Elgie Collard M.D.   On: 10/17/2023 19:21    Procedures Procedures  {Document cardiac monitor, telemetry assessment procedure when appropriate:1}  Medications Ordered in ED Medications  ondansetron (ZOFRAN) injection 4 mg (4 mg Intravenous Given 10/18/23 0022)    ED Course/ Medical Decision Making/ A&P                          Medical Decision Making Amount and/or Complexity of Data Reviewed Labs: ordered. Decision-making details documented in ED Course. Radiology: ordered. Decision-making details documented in ED Course.  Risk Prescription drug management.    This patient presents to the ED for concern of syncope, THC ingestion, altered mental status; this involves an extensive number of treatment options, and is a complaint that carries with it a high risk of complications and morbidity.  I considered the following differential and admission for this acute, potentially life threatening condition.   MDM:    Ddx of acute altered mental status and syncope considered but not limited to:  -Intracranial abnormalities such as ICH, hydrocephalus, head trauma -Infection such as UTI, PNA, or  meningitis -Toxic ingestion such as opioid overdose, anticholinergic toxicity, -Electrolyte abnormalities or hyper/hypoglycemia -Hypercarbia or hypoxia -Hepatic encephalopathy or uremia -ACS or arrhythmia -Endocrine abnormality such as thyroid storm or myxedema coma  Consider anemia and electrolyte abnormalities as possible etiologies. Consider arrhythmias and ACS, although without associated symptoms, less likely. Consider hemorrhage vs CVA, although neuro intact now for 24 hours and no associated other symptoms. Consider mets given known underlying cancer. Consider PE as well, although without hypoxia or sob. Consider broad  differential.  *** Given history, exam and workup, low suspicion for HF, ICH (no trauma, headache), seizure (no witnessed seizure like activity, no postictal period, tongue laceration, bladder incontinence), stroke (no focal neuro deficits), HOCM (no murmur, family history of sudden death), ACS (neg troponin, no anginal pain), aortic dissection (no chest pain), malignant arrhythmia on ekg or any family history of sudden death, or GI bleed (stable hgb). Low suspicion for PE given normal vital signs, absence of chest pain or dyspnea, no evidence of DVT, no recent surgery/immobilization.    Clinical Course as of 10/18/23 1650  Tue Oct 17, 2023  1838 WBC(!): 20.2 +leukocytosis w/ left shift [HN]  1953 CT Head Wo Contrast No acute intracranial abnormality. [HN]  1953 CT Cervical Spine Wo Contrast Normal study. [HN]  1953 DG Chest 1 View No active disease. [HN]  1953 Comprehensive metabolic panel(!) Mildly elevated ALT, did not have any obvious RUQ TTP but will have to evaluate when more awake.  [HN]  2112 Alcohol, Ethyl (B): <10 neg [HN]  2112 Resp panel by RT-PCR (RSV, Flu A&B, Covid) Anterior Nasal Swab neg [HN]  2112 DG Chest 1 View No active disease. [HN]  2310 MTF [MK]    Clinical Course User Index [HN] Loetta Rough, MD [MK] Kommor, Wyn Forster, MD    Labs: I Ordered, and personally interpreted labs.  The pertinent results include: Those listed above  Imaging Studies ordered: I ordered imaging studies including CTH, CT C-spine, CXR I independently visualized and interpreted imaging. I agree with the radiologist interpretation  Additional history obtained from chart review, wife at bedside.   Cardiac Monitoring: The patient was maintained on a cardiac monitor.  I personally viewed and interpreted the cardiac monitored which showed an underlying rhythm of: Sinus tachycardia  Reevaluation: After the interventions noted above, I reevaluated the patient and found that they have  :improved  Social Determinants of Health:  lives independently  Disposition:  Patient is signed out to the oncoming ED physician Dr. Posey Rea who is made aware of his history, presentation, exam, workup, and plan. Plan is to MTF and reassess.    Co morbidities that complicate the patient evaluation  Past Medical History:  Diagnosis Date   Allergy      Medicines Meds ordered this encounter  Medications   ondansetron (ZOFRAN) injection 4 mg    I have reviewed the patients home medicines and have made adjustments as needed  Problem List / ED Course: Problem List Items Addressed This Visit   None Visit Diagnoses       Accidental drug overdose, initial encounter    -  Primary            {Document critical care time when appropriate:1} {Document review of labs and clinical decision tools ie heart score, Chads2Vasc2 etc:1}  {Document your independent review of radiology images, and any outside records:1} {Document your discussion with family members, caretakers, and with consultants:1} {Document social determinants of health affecting pt's care:1} {Document your decision making  why or why not admission, treatments were needed:1}  This note was created using dictation software, which may contain spelling or grammatical errors.

## 2023-10-18 MED ORDER — ONDANSETRON HCL 4 MG/2ML IJ SOLN
4.0000 mg | Freq: Once | INTRAMUSCULAR | Status: AC
  Administered 2023-10-18: 4 mg via INTRAVENOUS
  Filled 2023-10-18: qty 2

## 2023-10-18 NOTE — ED Notes (Signed)
Patient ambulated in hallway with no assistance or issues. MD aware and witnessed. Opportunities given to ask questions prior to d/c. Patient and family had no questions.

## 2023-10-18 NOTE — ED Provider Notes (Signed)
  Physical Exam  BP 132/89   Pulse (!) 103   Temp 97.6 F (36.4 C) (Oral)   Resp 18   Ht 5\' 10"  (1.778 m)   Wt 104.3 kg   SpO2 98%   BMI 33.00 kg/m   Physical Exam Constitutional:      General: He is not in acute distress.    Appearance: Normal appearance.  HENT:     Head: Normocephalic and atraumatic.     Nose: No congestion or rhinorrhea.  Eyes:     General:        Right eye: No discharge.        Left eye: No discharge.     Extraocular Movements: Extraocular movements intact.     Pupils: Pupils are equal, round, and reactive to light.  Cardiovascular:     Rate and Rhythm: Normal rate and regular rhythm.     Heart sounds: No murmur heard. Pulmonary:     Effort: No respiratory distress.     Breath sounds: No wheezing or rales.  Abdominal:     General: There is no distension.     Tenderness: There is no abdominal tenderness.  Musculoskeletal:        General: Normal range of motion.     Cervical back: Normal range of motion.  Skin:    General: Skin is warm and dry.  Neurological:     General: No focal deficit present.     Mental Status: He is alert.     Procedures  Procedures  ED Course / MDM   Clinical Course as of 10/18/23 0113  Tue Oct 17, 2023  1838 WBC(!): 20.2 +leukocytosis w/ left shift [HN]  1953 CT Head Wo Contrast No acute intracranial abnormality. [HN]  1953 CT Cervical Spine Wo Contrast Normal study. [HN]  1953 DG Chest 1 View No active disease. [HN]  1953 Comprehensive metabolic panel(!) Mildly elevated ALT, did not have any obvious RUQ TTP but will have to evaluate when more awake.  [HN]  2112 Alcohol, Ethyl (B): <10 neg [HN]  2112 Resp panel by RT-PCR (RSV, Flu A&B, Covid) Anterior Nasal Swab neg [HN]  2112 DG Chest 1 View No active disease. [HN]  2310 MTF [MK]    Clinical Course User Index [HN] Loetta Rough, MD [MK] Glendora Score, MD   Medical Decision Making Amount and/or Complexity of Data Reviewed Labs: ordered.  Decision-making details documented in ED Course. Radiology: ordered. Decision-making details documented in ED Course.  Risk Prescription drug management.   Patient received in handoff.  Delta 8 edible intoxication pending metabolization of underlying substances.  Patient successfully metabolized underlying THC and was able to ambulate and tolerate p.o. without difficulty.  Patient discharged with outpatient follow-up and return precautions of which he and his wife voiced understanding       Majesty Oehlert, Wyn Forster, MD 10/18/23 [phone]

## 2023-10-20 NOTE — ED Provider Notes (Incomplete)
[place] EMERGENCY DEPARTMENT AT [facility] Provider Note   CSN: [id number] Arrival date & time: 10/17/23  1735     History {Add pertinent medical, surgical, social history, OB history to HPI:1} Chief Complaint  Patient presents with  . Drug Overdose    Samuel Fields is a 27 y.o. male with PMH as listed below who presents with THC ingestion.   Wife at bedside states that her husband's coworkers at [employer] called her to tell her that he passed out.  She sent her parents to go pick him up and he was altered and confused, kept saying "I am sorry, I am stupid." This has never happened to him before and he does not normally use drugs/EtOH.  On my specimen of the patient and he is GCS 12, moves all extremities, intermittently confused but is oriented to self and knows who his wife is.  He states that he did use a THC pen today, does not remember if he fell or hit his head, unsure about what else happened today.  He reports no head or neck pain, denies any pain elsewhere.  States he was otherwise in his normal state of health. He complains of feeling very thirsty and dehydrated.  Past Medical History:  Diagnosis Date  . Allergy        Home Medications Prior to Admission medications   Medication Sig Start Date End Date Taking? Authorizing Provider  ibuprofen (ADVIL) 200 MG tablet Take 400 mg by mouth daily as needed for headache.   Yes [provider]  omeprazole (PRILOSEC OTC) 20 MG tablet Take 20 mg by mouth daily as needed (heartburn).   Yes [provider]      Allergies    Patient has no known allergies.    Review of Systems   Review of Systems A 10 point review of systems was performed and is negative unless otherwise reported in HPI.  Physical Exam Updated Vital Signs BP 132/74   Pulse 74   Temp 98 F (36.7 C)   Resp 20   Ht 5\' 10"  (1.778 m)   Wt 104.3 kg   SpO2 97%   BMI 33.00 kg/m  Physical Exam General: Normal appearing  male, lying in bed.  HEENT: NCAT.  PERRLA midrange, Sclera anicteric, trachea midline.  Tongue protrudes midline.  Dry mucous membranes.  No midline C-spine tenderness palpation step-offs or deformities. Cardiology: RRR, no murmurs/rubs/gallops.  Resp: Normal respiratory rate and effort. CTAB, no wheezes, rhonchi, crackles.  Abd: Soft, non-tender, non-distended. No rebound tenderness or guarding.  GU: Deferred. MSK: No peripheral edema or signs of trauma. Extremities without deformity or TTP. No cyanosis or clubbing. Skin: warm, dry. Back: No CVA tenderness Neuro: Lethargic, oriented to self and situation, CNs II-XII grossly intact.  5 out of 5 strength in all extremities.. Sensation grossly intact.  Slurred speech.  ED Results / Procedures / Treatments   Labs (all labs ordered are listed, but only abnormal results are displayed) Labs Reviewed  CBC WITH DIFFERENTIAL/PLATELET - Abnormal; Notable for the following components:      Result Value   WBC 20.2 (*)    Neutro Abs 17.5 (*)    Abs Immature Granulocytes 0.10 (*)    All other components within normal limits  COMPREHENSIVE METABOLIC PANEL - Abnormal; Notable for the following components:   Glucose, Bld 124 (*)    ALT 69 (*)    All other components within normal limits  RESP PANEL BY RT-PCR (  RSV, FLU A&B, COVID)  RVPGX2  ETHANOL    EKG EKG Interpretation Date/Time:  Tuesday October 17 2023 17:53:58 EST Ventricular Rate:  115 PR Interval:  164 QRS Duration:  84 QT Interval:  336 QTC Calculation: 464 R Axis:   129  Text Interpretation: Sinus tachycardia Right axis deviation Pulmonary disease pattern Confirmed by Vivi Barrack [phone]) on 10/17/2023 6:36:05 PM  Radiology CT Cervical Spine Wo Contrast Result Date: 10/17/2023 CLINICAL DATA:  Neck trauma, intoxicated or obtunded (Age >= 16y). Drug overdose. EXAM: CT CERVICAL SPINE WITHOUT CONTRAST TECHNIQUE: Multidetector CT imaging of the cervical spine was performed without  intravenous contrast. Multiplanar CT image reconstructions were also generated. RADIATION DOSE REDUCTION: This exam was performed according to the departmental dose-optimization program which includes automated exposure control, adjustment of the mA and/or kV according to patient size and/or use of iterative reconstruction technique. COMPARISON:  None Available. FINDINGS: Alignment: Normal Skull base and vertebrae: No acute fracture. No primary bone lesion or focal pathologic process. Soft tissues and spinal canal: No prevertebral fluid or swelling. No visible canal hematoma. Disc levels:  Normal Upper chest: Negative Other: None IMPRESSION: Normal study. Electronically Signed   By: Charlett Nose M.D.   On: 10/17/2023 19:36   CT Head Wo Contrast Result Date: 10/17/2023 CLINICAL DATA:  Mental status changes.  Drug overdose. EXAM: CT HEAD WITHOUT CONTRAST TECHNIQUE: Contiguous axial images were obtained from the base of the skull through the vertex without intravenous contrast. RADIATION DOSE REDUCTION: This exam was performed according to the departmental dose-optimization program which includes automated exposure control, adjustment of the mA and/or kV according to patient size and/or use of iterative reconstruction technique. COMPARISON:  None Available. FINDINGS: Brain: No acute intracranial abnormality. Specifically, no hemorrhage, hydrocephalus, mass lesion, acute infarction, or significant intracranial injury. Vascular: No hyperdense vessel or unexpected calcification. Skull: No acute calvarial abnormality. Sinuses/Orbits: No acute findings Other: None IMPRESSION: No acute intracranial abnormality. Electronically Signed   By: Charlett Nose M.D.   On: 10/17/2023 19:35   DG Chest 1 View Result Date: 10/17/2023 CLINICAL DATA:  Syncope and tachycardia. Leukocytosis. Altered mental status. EXAM: CHEST  1 VIEW COMPARISON:  None Available. FINDINGS: Shallow inspiration. No focal consolidation, pleural effusion,  or pneumothorax. The cardiac silhouette is within normal limits. No acute osseous pathology. IMPRESSION: No active disease. Electronically Signed   By: Elgie Collard M.D.   On: 10/17/2023 19:21    Procedures Procedures  {Document cardiac monitor, telemetry assessment procedure when appropriate:1}  Medications Ordered in ED Medications  ondansetron (ZOFRAN) injection 4 mg (4 mg Intravenous Given 10/18/23 0022)    ED Course/ Medical Decision Making/ A&P                          Medical Decision Making Amount and/or Complexity of Data Reviewed Labs: ordered. Decision-making details documented in ED Course. Radiology: ordered. Decision-making details documented in ED Course.  Risk Prescription drug management.    This patient presents to the ED for concern of syncope, THC ingestion, altered mental status; this involves an extensive number of treatment options, and is a complaint that carries with it a high risk of complications and morbidity.  I considered the following differential and admission for this acute, potentially life threatening condition.   MDM:    Ddx of acute altered mental status and syncope considered but not limited to:  -Intracranial abnormalities such as ICH, hydrocephalus, head trauma -Infection such as UTI, PNA, or  meningitis -Toxic ingestion such as opioid overdose, anticholinergic toxicity, -Electrolyte abnormalities or hyper/hypoglycemia -Hypercarbia or hypoxia -Hepatic encephalopathy or uremia -ACS or arrhythmia -Endocrine abnormality such as thyroid storm or myxedema coma  Consider anemia and electrolyte abnormalities as possible etiologies. Consider arrhythmias and ACS, although without associated symptoms, less likely. Consider hemorrhage vs CVA, although neuro intact now for 24 hours and no associated other symptoms. Consider mets given known underlying cancer. Consider PE as well, although without hypoxia or sob. Consider broad  differential.  *** Given history, exam and workup, low suspicion for HF, ICH (no trauma, headache), seizure (no witnessed seizure like activity, no postictal period, tongue laceration, bladder incontinence), stroke (no focal neuro deficits), HOCM (no murmur, family history of sudden death), ACS (neg troponin, no anginal pain), aortic dissection (no chest pain), malignant arrhythmia on ekg or any family history of sudden death, or GI bleed (stable hgb). Low suspicion for PE given normal vital signs, absence of chest pain or dyspnea, no evidence of DVT, no recent surgery/immobilization.    Clinical Course as of 10/18/23 1650  Tue Oct 17, 2023  1838 WBC(!): 20.2 +leukocytosis w/ left shift [HN]  1953 CT Head Wo Contrast No acute intracranial abnormality. [HN]  1953 CT Cervical Spine Wo Contrast Normal study. [HN]  1953 DG Chest 1 View No active disease. [HN]  1953 Comprehensive metabolic panel(!) Mildly elevated ALT, did not have any obvious RUQ TTP but will have to evaluate when more awake.  [HN]  2112 Alcohol, Ethyl (B): <10 neg [HN]  2112 Resp panel by RT-PCR (RSV, Flu A&B, Covid) Anterior Nasal Swab neg [HN]  2112 DG Chest 1 View No active disease. [HN]  2310 MTF [MK]    Clinical Course User Index [HN] Loetta Rough, MD [MK] Kommor, Wyn Forster, MD    Labs: I Ordered, and personally interpreted labs.  The pertinent results include: Those listed above  Imaging Studies ordered: I ordered imaging studies including CTH, CT C-spine, CXR I independently visualized and interpreted imaging. I agree with the radiologist interpretation  Additional history obtained from chart review, wife at bedside.   Cardiac Monitoring: .The patient was maintained on a cardiac monitor.  I personally viewed and interpreted the cardiac monitored which showed an underlying rhythm of: Sinus tachycardia  Reevaluation: After the interventions noted above, I reevaluated the patient and found that they  have :improved  Social Determinants of Health: . lives independently  Disposition:  Patient is signed out to the oncoming ED physician Dr. Posey Rea who is made aware of his history, presentation, exam, workup, and plan. Plan is to MTF and reassess.    Co morbidities that complicate the patient evaluation . Past Medical History:  Diagnosis Date  . Allergy      Medicines Meds ordered this encounter  Medications  . ondansetron (ZOFRAN) injection 4 mg    I have reviewed the patients home medicines and have made adjustments as needed  Problem List / ED Course: Problem List Items Addressed This Visit   None Visit Diagnoses       Accidental drug overdose, initial encounter    -  Primary            {Document critical care time when appropriate:1} {Document review of labs and clinical decision tools ie heart score, Chads2Vasc2 etc:1}  {Document your independent review of radiology images, and any outside records:1} {Document your discussion with family members, caretakers, and with consultants:1} {Document social determinants of health affecting pt's care:1} {Document your decision making  why or why not admission, treatments were needed:1}  This note was created using dictation software, which may contain spelling or grammatical errors.

## 2023-10-22 ENCOUNTER — Telehealth: Payer: Medicaid Other

## 2023-10-22 NOTE — Progress Notes (Deleted)
Virtual Visit Consent   Samuel Fields, you are scheduled for a virtual visit with a [facility] provider today. Just as with appointments in the office, your consent must be obtained to participate. Your consent will be active for this visit and any virtual visit you may have with one of our providers in the next 365 days. If you have a MyChart account, a copy of this consent can be sent to you electronically.  As this is a virtual visit, video technology does not allow for your provider to perform a traditional examination. This may limit your provider's ability to fully assess your condition. If your provider identifies any concerns that need to be evaluated in person or the need to arrange testing (such as labs, EKG, etc.), we will make arrangements to do so. Although advances in technology are sophisticated, we cannot ensure that it will always work on either your end or our end. If the connection with a video visit is poor, the visit may have to be switched to a telephone visit. With either a video or telephone visit, we are not always able to ensure that we have a secure connection.  By engaging in this virtual visit, you consent to the provision of healthcare and authorize for your insurance to be billed (if applicable) for the services provided during this visit. Depending on your insurance coverage, you may receive a charge related to this service.  I need to obtain your verbal consent now. Are you willing to proceed with your visit today? Samuel Fields has provided verbal consent on 10/22/2023 for a virtual visit (video or telephone). Jannifer Rodney, FNP  Date: 10/22/2023 10:08 AM  Virtual Visit via Video Note   I, Jannifer Rodney, connected with  Samuel Fields  ([id number], 07/28/1996) on 10/22/23 at 10:15 AM EST by a video-enabled telemedicine application and verified that I am speaking with the correct person using two identifiers.  Location: Patient: {Virtual Visit Location  Patient:[id number]::"Home"} Provider: Virtual Visit Location Provider: Home Office   I discussed the limitations of evaluation and management by telemedicine and the availability of in person appointments. The patient expressed understanding and agreed to proceed.    History of Present Illness: Samuel Fields is a 27 y.o. who identifies as a male who was assigned male at birth, and is being seen today for ***.  HPI: HPI  Problems:  Patient Active Problem List   Diagnosis Date Noted   Preventative health care 12/07/2021   Family history of early CAD 12/07/2021   Acute foot pain, right 11/30/2021   Chronic pain in left foot 10/29/2018   Hyperlipidemia 07/05/2017   Elevated blood pressure reading 07/05/2017   Skin tag 07/05/2017    Allergies: No Known Allergies Medications:  Current Outpatient Medications:    ibuprofen (ADVIL) 200 MG tablet, Take 400 mg by mouth daily as needed for headache., Disp: , Rfl:    omeprazole (PRILOSEC OTC) 20 MG tablet, Take 20 mg by mouth daily as needed (heartburn)., Disp: , Rfl:   Observations/Objective: Patient is well-developed, well-nourished in no acute distress.  Resting comfortably *** at home.  Head is normocephalic, atraumatic.  No labored breathing. *** Speech is clear and coherent with logical content.  Patient is alert and oriented at baseline.  ***  Assessment and Plan: There are no diagnoses linked to this encounter. ***  Follow Up Instructions: I discussed the assessment and treatment plan with the patient. The patient was provided an opportunity to ask questions  and all were answered. The patient agreed with the plan and demonstrated an understanding of the instructions.  A copy of instructions were sent to the patient via MyChart unless otherwise noted below.   {EMAIL AVS:[id number]::"Patient has requested to receive PHI (AVS, Work Notes, etc) pertaining to this video visit through e-mail as they are currently without active  MyChart. They have voiced understand that email is not considered secure and their health information could be viewed by someone other than the patient. "}  The patient was advised to call back or seek an in-person evaluation if the symptoms worsen or if the condition fails to improve as anticipated.    Jannifer Rodney, FNP

## 2023-10-23 ENCOUNTER — Telehealth: Payer: Medicaid Other | Admitting: Physician Assistant

## 2023-10-23 DIAGNOSIS — J069 Acute upper respiratory infection, unspecified: Secondary | ICD-10-CM

## 2023-10-23 NOTE — Progress Notes (Signed)
Patient needing past date work notes. Advised of work note policy and advised to contact PCP for work notes needed. He voiced understanding.

## 2023-11-01 ENCOUNTER — Telehealth: Payer: Medicaid Other | Admitting: Primary Care

## 2023-11-28 ENCOUNTER — Telehealth: Payer: Medicaid Other | Admitting: Family Medicine

## 2023-11-28 DIAGNOSIS — R6889 Other general symptoms and signs: Secondary | ICD-10-CM

## 2023-11-28 MED ORDER — OSELTAMIVIR PHOSPHATE 75 MG PO CAPS: 75.0000 mg | ORAL_CAPSULE | Freq: Two times a day (BID) | ORAL | 0 refills | Status: AC

## 2023-11-28 NOTE — Progress Notes (Signed)
 Virtual Visit Consent   Samuel Fields, you are scheduled for a virtual visit with a [facility] provider today. Just as with appointments in the office, your consent must be obtained to participate. Your consent will be active for this visit and any virtual visit you may have with one of our providers in the next 365 days. If you have a MyChart account, a copy of this consent can be sent to you electronically.  As this is a virtual visit, video technology does not allow for your provider to perform a traditional examination. This may limit your provider's ability to fully assess your condition. If your provider identifies any concerns that need to be evaluated in person or the need to arrange testing (such as labs, EKG, etc.), we will make arrangements to do so. Although advances in technology are sophisticated, we cannot ensure that it will always work on either your end or our end. If the connection with a video visit is poor, the visit may have to be switched to a telephone visit. With either a video or telephone visit, we are not always able to ensure that we have a secure connection.  By engaging in this virtual visit, you consent to the provision of healthcare and authorize for your insurance to be billed (if applicable) for the services provided during this visit. Depending on your insurance coverage, you may receive a charge related to this service.  I need to obtain your verbal consent now. Are you willing to proceed with your visit today? Samuel Fields has provided verbal consent on 11/28/2023 for a virtual visit (video or telephone). Chiquita CHRISTELLA Barefoot, NP  Date: 11/28/2023 1:41 PM  Virtual Visit via Video Note   I, Chiquita CHRISTELLA Barefoot, connected with  Samuel Fields  ([id number], [birth year]) on 11/28/23 at  1:45 PM EST by a video-enabled telemedicine application and verified that I am speaking with the correct person using two identifiers.  Location: Patient: Virtual Visit Location Patient:  Home Provider: Virtual Visit Location Provider: Home Office   I discussed the limitations of evaluation and management by telemedicine and the availability of in person appointments. The patient expressed understanding and agreed to proceed.    History of Present Illness: Samuel Fields is a 28 y.o. who identifies as a male who was assigned male at birth, and is being seen today for Flu like symptoms  Onset was in last day or so- when his son got sick- is getting worse  Associated symptoms are headaches, sore throat, body aches  Modifying factors are tylenol , ny quil, tamiflu  (had several mls of left over dose)  Denies chest pain, shortness of breath, fevers, chills  Exposure to sick contacts- known Flu +   Problems:  Patient Active Problem List   Diagnosis Date Noted   Preventative health care 12/07/2021   Family history of early CAD 12/07/2021   Acute foot pain, right 11/30/2021   Chronic pain in left foot 10/29/2018   Hyperlipidemia 07/05/2017   Elevated blood pressure reading 07/05/2017   Skin tag 07/05/2017    Allergies: No Known Allergies Medications:  Current Outpatient Medications:    ibuprofen  (ADVIL ) 200 MG tablet, Take 400 mg by mouth daily as needed for headache., Disp: , Rfl:    omeprazole (PRILOSEC OTC) 20 MG tablet, Take 20 mg by mouth daily as needed (heartburn)., Disp: , Rfl:   Observations/Objective: Patient is well-developed, well-nourished in no acute distress.  Resting comfortably  at home.  Head is  normocephalic, atraumatic.  No labored breathing.  Speech is clear and coherent with logical content.  Patient is alert and oriented at baseline.    Assessment and Plan:  1. Flu-like symptoms (Primary)  - oseltamivir  (TAMIFLU ) 75 MG capsule; Take 1 capsule (75 mg total) by mouth 2 (two) times daily for 5 days.  Dispense: 10 capsule; Refill: 0   - Continue OTC symptomatic management of choice  - Take prescribed medications as directed - Push fluids -  Rest as needed - Discussed return precautions and when to seek in-person evaluation, sent via AVS as well  Reviewed side effects, risks and benefits of medication.    Patient acknowledged agreement and understanding of the plan.   Past Medical, Surgical, Social History, Allergies, and Medications have been Reviewed.   Follow Up Instructions: I discussed the assessment and treatment plan with the patient. The patient was provided an opportunity to ask questions and all were answered. The patient agreed with the plan and demonstrated an understanding of the instructions.  A copy of instructions were sent to the patient via MyChart unless otherwise noted below.    The patient was advised to call back or seek an in-person evaluation if the symptoms worsen or if the condition fails to improve as anticipated.    Chiquita CHRISTELLA Barefoot, NP

## 2023-11-28 NOTE — Patient Instructions (Signed)
 Samuel Fields, thank you for joining Samuel CHRISTELLA Barefoot, NP for today's virtual visit.  While this provider is not your primary care provider (PCP), if your PCP is located in our provider database this encounter information will be shared with them immediately following your visit.   A [facility] MyChart account gives you access to today's visit and all your visits, tests, and labs performed at [facility]  click here if you don't have a [facility] MyChart account or go to [email or web address]  Consent: (Patient) Samuel Fields provided verbal consent for this virtual visit at the beginning of the encounter.  Current Medications:  Current Outpatient Medications:    oseltamivir  (TAMIFLU ) 75 MG capsule, Take 1 capsule (75 mg total) by mouth 2 (two) times daily for 5 days., Disp: 10 capsule, Rfl: 0   ibuprofen  (ADVIL ) 200 MG tablet, Take 400 mg by mouth daily as needed for headache., Disp: , Rfl:    omeprazole (PRILOSEC OTC) 20 MG tablet, Take 20 mg by mouth daily as needed (heartburn)., Disp: , Rfl:    Medications ordered in this encounter:  Meds ordered this encounter  Medications   oseltamivir  (TAMIFLU ) 75 MG capsule    Sig: Take 1 capsule (75 mg total) by mouth 2 (two) times daily for 5 days.    Dispense:  10 capsule    Refill:  0    Supervising Provider:   BLAISE ALEENE KIDD [[id number]]     *If you need refills on other medications prior to your next appointment, please contact your pharmacy*  Follow-Up: Call back or seek an in-person evaluation if the symptoms worsen or if the condition fails to improve as anticipated.  [facility] [phone]  Other Instructions Influenza, Adult Influenza is also called the flu. It's an infection that affects your respiratory tract. This includes your nose, throat, windpipe, and lungs. The flu is contagious. This means it spreads easily from person to person. It causes symptoms that are like a cold. It can also  cause a high fever and body aches. What are the causes? The flu is caused by the influenza virus. You can get it by: Breathing in droplets that are in the air after an infected person coughs or sneezes. Touching something that has the virus on it and then touching your mouth, nose, or eyes. What increases the risk? You may be more likely to get the flu if: You don't wash your hands often. You're near a lot of people during cold and flu season. You touch your mouth, eyes, or nose without washing your hands first. You don't get a flu shot each year. You may also be more at risk for the flu and serious problems, such as a lung infection called pneumonia, if: You're older than 65. You're pregnant. Your immune system is weak. Your immune system is your body's defense system. You have a long-term, or chronic, condition, such as: Heart, kidney, or lung disease. Diabetes. A liver disorder. Asthma. You're very overweight. You have anemia. This is when you don't have enough red blood cells in your body. What are the signs or symptoms? Flu symptoms often start all of a sudden. They may last 4-14 days and include: Fever and chills. Headaches, body aches, or muscle aches. Sore throat. Cough. Runny or stuffy nose. Discomfort in your chest. Not wanting to eat as much as normal. Feeling weak or tired. Feeling dizzy. Nausea or vomiting. How is this diagnosed? The flu may be diagnosed based  on your symptoms and medical history. You may also have a physical exam. A swab may be taken from your nose or throat and tested for the virus. How is this treated? If the flu is found early, you can be treated with antiviral medicine. This may be given to you by mouth or through an IV. It can help you feel less sick and get better faster. Taking care of yourself at home can also help your symptoms get better. Your health care provider may tell you to: Take over-the-counter medicines. Drink lots of  fluids. The flu often goes away on its own. If you have very bad symptoms or problems caused by the flu, you may need to be treated in a hospital. Follow these instructions at home: Activity Rest as needed. Get lots of sleep. Stay home from work or school as told by your provider. Leave home only to go see your provider. Do not leave home for other reasons until you don't have a fever for 24 hours without taking medicine. Eating and drinking Take an oral rehydration solution (ORS). This is a drink that is sold at pharmacies and stores. Drink enough fluid to keep your pee pale yellow. Try to drink small amounts of clear fluids. These include water, ice chips, fruit juice mixed with water, and low-calorie sports drinks. Try to eat bland foods that are easy to digest. These include bananas, applesauce, rice, lean meats, toast, and crackers. Avoid drinks that have a lot of sugar or caffeine in them. These include energy drinks, regular sports drinks, and soda. Do not drink alcohol. Do not eat spicy or fatty foods. General instructions     Take your medicines only as told by your provider. Use a cool mist humidifier to add moisture to the air in your home. This can make it easier for you to breathe. You should also clean the humidifier every day. To do so: Empty the water. Pour clean water in. Cover your mouth and nose when you cough or sneeze. Wash your hands with soap and water often and for at least 20 seconds. It's extra important to do so after you cough or sneeze. If you can't use soap and water, use hand sanitizer. How is this prevented?  Get a flu shot every year. Ask your provider when you should get your flu shot. Stay away from people who are sick during fall and winter. Fall and winter are cold and flu season. Contact a health care provider if: You get new symptoms. You have chest pain. You have watery poop, also called diarrhea. You have a fever. Your cough gets worse. You  start to have more mucus. You feel like you may vomit, or you vomit. Get help right away if: You become short of breath or have trouble breathing. Your skin or nails turn blue. You have very bad pain or stiffness in your neck. You get a sudden headache or pain in your face or ear. You vomit each time you eat or drink. These symptoms may be an emergency. Call 911 right away. Do not wait to see if the symptoms will go away. Do not drive yourself to the hospital. This information is not intended to replace advice given to you by your health care provider. Make sure you discuss any questions you have with your health care provider. Document Revised: 07/13/2023 Document Reviewed: 11/17/2022 Elsevier Patient Education  2024 [employer].   If you have been instructed to have an in-person evaluation today at  a local Urgent Care facility, please use the link below. It will take you to a list of all of our available [facility], including address, phone number and hours of operation. Please do not delay care.  [facility]  If you or a family member do not have a primary care provider, use the link below to schedule a visit and establish care. When you choose a [facility] primary care physician or advanced practice provider, you gain a long-term partner in health. Find a Primary Care Provider  Learn more about [facility]'s in-office and virtual care options: [facility] Get Care Now

## 2024-02-05 ENCOUNTER — Telehealth: Admitting: Physician Assistant

## 2024-02-05 ENCOUNTER — Emergency Department (HOSPITAL_COMMUNITY)

## 2024-02-05 ENCOUNTER — Emergency Department (HOSPITAL_COMMUNITY)
Admission: EM | Admit: 2024-02-05 | Discharge: 2024-02-05 | Disposition: A | Discharge status: Home / Self Care | Attending: Emergency Medicine | Admitting: Emergency Medicine

## 2024-02-05 ENCOUNTER — Ambulatory Visit (HOSPITAL_BASED_OUTPATIENT_CLINIC_OR_DEPARTMENT_OTHER): Admitting: Student

## 2024-02-05 ENCOUNTER — Encounter (HOSPITAL_BASED_OUTPATIENT_CLINIC_OR_DEPARTMENT_OTHER): Payer: Self-pay

## 2024-02-05 ENCOUNTER — Other Ambulatory Visit: Payer: Self-pay

## 2024-02-05 DIAGNOSIS — M722 Plantar fascial fibromatosis: Secondary | ICD-10-CM | POA: Diagnosis not present

## 2024-02-05 DIAGNOSIS — M79671 Pain in right foot: Secondary | ICD-10-CM | POA: Diagnosis present

## 2024-02-05 MED ORDER — KETOROLAC TROMETHAMINE 30 MG/ML IJ SOLN
30.0000 mg | Freq: Once | INTRAMUSCULAR | Status: AC
  Administered 2024-02-05: 30 mg via INTRAMUSCULAR
  Filled 2024-02-05: qty 1

## 2024-02-05 NOTE — Patient Instructions (Signed)
  Samuel Fields, thank you for joining Angelia Kelp, PA-C for today's virtual visit.  While this provider is not your primary care provider (PCP), if your PCP is located in our provider database this encounter information will be shared with them immediately following your visit.   A [facility] MyChart account gives you access to today's visit and all your visits, tests, and labs performed at  [facility] " click here if you don't have a [facility] MyChart account or go to [email or web address]  Consent: (Patient) Samuel Fields provided verbal consent for this virtual visit at the beginning of the encounter.  Current Medications:  Current Outpatient Medications:    ibuprofen (ADVIL) 200 MG tablet, Take 400 mg by mouth daily as needed for headache., Disp: , Rfl:    omeprazole (PRILOSEC OTC) 20 MG tablet, Take 20 mg by mouth daily as needed (heartburn)., Disp: , Rfl:    Medications ordered in this encounter:  No orders of the defined types were placed in this encounter.    *If you need refills on other medications prior to your next appointment, please contact your pharmacy*  Follow-Up: Call back or seek an in-person evaluation if the symptoms worsen or if the condition fails to improve as anticipated.  [facility] [phone]  Other Instructions Keep appt at 1030 with [facility]:  [facility] at [facility] [address], [address] [place], [place] [address] Phone: [phone]   If you have been instructed to have an in-person evaluation today at a local Urgent Care facility, please use the link below. It will take you to a list of all of our available [facility], including address, phone number and hours of operation. Please do not delay care.  [facility]  If you or a family member do not have a primary care provider, use the link below to schedule a visit and  establish care. When you choose a [facility] primary care physician or advanced practice provider, you gain a long-term partner in health. Find a Primary Care Provider  Learn more about [facility]'s in-office and virtual care options: [facility] - Get Care Now

## 2024-02-05 NOTE — ED Provider Notes (Signed)
 [facility]-EMERGENC[facility] DEPT [facility] Emergency Department Provider Note MRN:  [id number]  Arrival date & time: 02/05/24     Chief Complaint   Foot Pain   History of Present Illness   Samuel Fields is a 28 y.o. year-old male presents to the ED with chief complaint of Right foot pain.  Reports history of plantar fasciitis.  He states that he does see a foot and ankle specialist.  States that he has an appointment scheduled for Thursday.  He states that he is concerned about a stress fracture.  He reports that he has stress fractures from time to time and is requesting an x-ray.  He has tried taking oxycodone without much relief.  He states that he has some pain that radiates from the foot into the calf today.  History provided by patient.   Review of Systems  Pertinent positive and negative review of systems noted in HPI.    Physical Exam   Vitals:   02/05/24 0512  BP: (!) 148/101  Pulse: 76  Resp: 16  Temp: 99 F (37.2 C)  SpO2: 100%    CONSTITUTIONAL:  non toxic-appearing, NAD NEURO:  Alert and oriented x 3, CN 3-12 grossly intact EYES:  eyes equal and reactive ENT/NECK:  Supple, no stridor  CARDIO:  appears well-perfused, intact distal pulses PULM:  No respiratory distress,  GI/GU:  non-distended,  MSK/SPINE:  No gross deformities, no edema, moves all extremities, no calf tenderness, swelling, or erythema, there is some TTP to the medial foot/arch  SKIN:  no rash, atraumatic   *Additional and/or pertinent findings included in MDM below  Diagnostic and Interventional Summary    EKG Interpretation Date/Time:    Ventricular Rate:    PR Interval:    QRS Duration:    QT Interval:    QTC Calculation:   R Axis:      Text Interpretation:         Labs Reviewed - No data to display  DG Ankle Complete Right  Final Result    DG Foot Complete Right  Final Result      Medications  ketorolac (TORADOL) 30 MG/ML injection 30 mg (has no administration in time  range)     Procedures  /  Critical Care Procedures  ED Course and Medical Decision Making  I have reviewed the triage vital signs, the nursing notes, and pertinent available records from the EMR.  Social Determinants Affecting Complexity of Care: Patient has no clinically significant social determinants affecting this chief complaint..   ED Course:    Medical Decision Making Patient presents with injury to right foot.  DDx includes, fracture, strain, or sprain.  Consultants: none  Plain films reveal no fx.  Pt advised to follow up with PCP and/or orthopedics. Patient given post op shoe and crutches while in ED, conservative therapy such as RICE recommended and discussed.   Patient will be discharged home & is agreeable with above plan. Returns precautions discussed. Pt appears safe for discharge.   Amount and/or Complexity of Data Reviewed Radiology: ordered and independent interpretation performed.    Details: No fracture or dislocation seen  Risk Prescription drug management.         Consultants: No consultations were needed in caring for this patient.   Treatment and Plan: Emergency department workup does not suggest an emergent condition requiring admission or immediate intervention beyond  what has been performed at this time. The patient is safe for discharge and has  been instructed to  return immediately for worsening symptoms, change in  symptoms or any other concerns    Final Clinical Impressions(s) / ED Diagnoses     ICD-10-CM   1. Plantar fasciitis  M72.2       ED Discharge Orders     None         Discharge Instructions Discussed with and Provided to Patient:   Discharge Instructions   None      Sherel Dikes, PA-C 02/05/24 2542    Teddi Favors, DO 02/05/24 [phone]

## 2024-02-05 NOTE — ED Triage Notes (Signed)
 Patient brought in from home via EMS with c/o right foot pain. Patient has a history of plantar fasciitis and states he is possibly having a flare up. Denies any trauma to his foot. Patient states he woke up around 3am with 10/10 pain and took a Oxycodone that did not help.

## 2024-02-05 NOTE — Progress Notes (Signed)
 Patient seen at ER this morning and has an appt with [facility] today at 1030. Advised to keep in person exam.  No Charge

## 2024-02-05 NOTE — Progress Notes (Signed)
 Orthopedic Tech Progress Note Patient Details:  Samuel Fields Jul 26, 1996 [id number]  Ortho Devices Type of Ortho Device: Crutches, Postop shoe/boot Ortho Device/Splint Location: rle Ortho Device/Splint Interventions: Ordered, Application, Adjustment   Post Interventions Patient Tolerated: Well Instructions Provided: Care of device, Adjustment of device  Samuel Fields 02/05/2024, 6:43 AM

## 2024-02-07 ENCOUNTER — Ambulatory Visit (INDEPENDENT_AMBULATORY_CARE_PROVIDER_SITE_OTHER): Payer: Self-pay | Admitting: Podiatry

## 2024-02-07 ENCOUNTER — Ambulatory Visit (INDEPENDENT_AMBULATORY_CARE_PROVIDER_SITE_OTHER)

## 2024-02-07 ENCOUNTER — Encounter: Payer: Self-pay | Admitting: Podiatry

## 2024-02-07 DIAGNOSIS — M7751 Other enthesopathy of right foot: Secondary | ICD-10-CM

## 2024-02-07 MED ORDER — METHYLPREDNISOLONE 4 MG PO TBPK: ORAL_TABLET | ORAL | 0 refills | Status: DC

## 2024-02-07 MED ORDER — TRIAMCINOLONE ACETONIDE 10 MG/ML IJ SUSP
10.0000 mg | Freq: Once | INTRAMUSCULAR | Status: AC
  Administered 2024-02-07: 10 mg via INTRA_ARTICULAR

## 2024-02-07 NOTE — Progress Notes (Signed)
 Subjective:   Patient ID: Samuel Fields, male   DOB: 28 y.o.   MRN: [id number]   HPI Patient states that he developed a lot of pain in his right lateral foot and into the ankle and states that it did wake him up sleeping he went to the ER and was x-rayed.  Patient did have pain medicine which helped him temporarily and patient does work on his feet in a cold environment does not smoke tries to be active   Review of Systems  All other systems reviewed and are negative.       Objective:  Physical Exam Vitals and nursing note reviewed.  Constitutional:      Appearance: He is well-developed.  Pulmonary:     Effort: Pulmonary effort is normal.  Musculoskeletal:        General: Normal range of motion.  Skin:    General: Skin is warm.  Neurological:     Mental Status: He is alert.     Neurovascular status intact muscle strength found to be adequate range of motion adequate with patient found to have intense discomfort dorsal lateral aspect of the right foot extending into the MPJs with inflammation around the joint surfaces.  Patient is found to have good digital perfusion well-oriented x 3     Assessment:  Appears to be acute inflammatory reaction cannot rule out localized from systemic like treatment and condition     Plan:  H&P x-ray reviewed went ahead today sterile prep and injected the dorsal lateral foot into the MPJs with 3 mg dexamethasone Kenalog 5 mg Xylocaine advised on ice therapy and placed on oral diclofenac.  Reappoint for us  to recheck again in the next several weeks and patient will be seen back to recheck  X-rays indicate there is no signs of fracture no signs of arthritis of the MPJs or other pathology present

## 2024-02-12 ENCOUNTER — Ambulatory Visit: Payer: Medicaid Other | Admitting: Family Medicine

## 2024-02-12 NOTE — Progress Notes (Deleted)
   New Patient Office Visit  Subjective   Patient ID: Samuel Fields, male    DOB: January 02, 1996  Age: 28 y.o. MRN: [id number]  CC: No chief complaint on file.   HPI Samuel Fields presents to establish care ***  Hx of elevated alt.    PMH: ***  PSH: ***  FH: ***  Tobacco use: *** Alcohol use: *** Drug use: *** Marital status: *** Employment: *** Sexual hx: ***  Screenings:  Colon Cancer: *** Lung Cancer: *** Breast Cancer: *** Diabetes: *** HLD: ***   Outpatient Encounter Medications as of 02/12/2024  Medication Sig   methylPREDNISolone  (MEDROL  DOSEPAK) 4 MG TBPK tablet follow package directions   No facility-administered encounter medications on file as of 02/12/2024.    Past Medical History:  Diagnosis Date   Allergy     No past surgical history on file.  Family History  Problem Relation Age of Onset   Hypertension Mother    Hypertension Father    Diabetes Father    Heart attack Father 56   Skin cancer Maternal Grandmother    Prostate cancer Maternal Grandfather    Diabetes Paternal Grandfather     Social History   Socioeconomic History   Marital status: Married    Spouse name: Not on file   Number of children: Not on file   Years of education: Not on file   Highest education level: Not on file  Occupational History   Not on file  Tobacco Use   Smoking status: Former   Smokeless tobacco: Former  Building services engineer status: Every Day  Substance and Sexual Activity   Alcohol use: Yes   Drug use: Not on file   Sexual activity: Not on file  Other Topics Concern   Not on file  Social History Narrative   Single.   No children.   Not working.    Enjoys spending time with friends.   Social Drivers of Corporate investment banker Strain: Not on file  Food Insecurity: Not on file  Transportation Needs: Not on file  Physical Activity: Not on file  Stress: Not on file  Social Connections: Not on file  Intimate Partner Violence: Not on  file    ROS     Objective   There were no vitals taken for this visit.  Physical Exam     Assessment & Plan:   There are no diagnoses linked to this encounter.  No follow-ups on file.   Laneta Pintos, MD

## 2024-02-20 ENCOUNTER — Ambulatory Visit: Admitting: Orthopedic Surgery

## 2024-02-28 ENCOUNTER — Ambulatory Visit (HOSPITAL_BASED_OUTPATIENT_CLINIC_OR_DEPARTMENT_OTHER): Admitting: Orthopaedic Surgery

## 2024-08-26 ENCOUNTER — Encounter: Payer: Self-pay | Admitting: Radiology

## 2024-10-13 ENCOUNTER — Telehealth: Admitting: Physician Assistant

## 2024-10-13 DIAGNOSIS — K529 Noninfective gastroenteritis and colitis, unspecified: Secondary | ICD-10-CM | POA: Diagnosis not present

## 2024-10-13 MED ORDER — ONDANSETRON 4 MG PO TBDP: 4.0000 mg | ORAL_TABLET | Freq: Three times a day (TID) | ORAL | 0 refills | Status: AC | PRN

## 2024-10-13 NOTE — Patient Instructions (Addendum)
 " Samuel Fields, thank you for joining [name], PA-C for today's virtual visit.  While this provider is not your primary care provider (PCP), if your PCP is located in our provider database this encounter information will be shared with them immediately following your visit.   A [facility] MyChart account gives you access to today's visit and all your visits, tests, and labs performed at [facility]  click here if you don't have a [facility] MyChart account or go to [email or web address]  Consent: (Patient) Samuel Fields provided verbal consent for this virtual visit at the beginning of the encounter.  Current Medications:  Current Outpatient Medications:    methylPREDNISolone  (MEDROL  DOSEPAK) 4 MG TBPK tablet, follow package directions, Disp: 21 tablet, Rfl: 0   Medications ordered in this encounter:  No orders of the defined types were placed in this encounter.    *If you need refills on other medications prior to your next appointment, please contact your pharmacy*  Follow-Up: Call back or seek an in-person evaluation if the symptoms worsen or if the condition fails to improve as anticipated.  [facility] [phone]  Other Instructions Hydrate and rest. Follow dietary recommendations below. The Zofran  is to be used as directed for nausea and to prevent vomiting. If you note any non-resolving, new, or worsening symptoms despite treatment, please seek an in-person evaluation ASAP.   Viral Gastroenteritis, Adult  Viral gastroenteritis is also known as the stomach flu. This condition may affect your stomach, your small intestine, and your large intestine. It can cause sudden watery poop (diarrhea), fever, and vomiting. This condition is caused by certain germs (viruses). These germs can be passed from person to person very easily (are contagious). Having watery poop and vomiting can make you feel weak and cause you to not have enough  water in your body (get dehydrated). This can make you tired and thirsty, make you have a dry mouth, and make it so you pee (urinate) less often. It is important to replace the fluids that you lose from having watery poop and vomiting. What are the causes? You can get sick by catching germs from other people. You can also get sick by: Eating food, drinking water, or touching a surface that has the germs on it (is contaminated). Sharing utensils or other personal items with a person who is sick. What increases the risk? Having a weak body defense system (immune system). Living with one or more children who are younger than 2 years. Living in a nursing home. Going on cruise ships. What are the signs or symptoms? Symptoms of this condition start suddenly. Symptoms may last for a few days or for as long as a week. Common symptoms include: Watery poop. Vomiting. Other symptoms include: Fever. Headache. Feeling tired (fatigue). Pain in the belly (abdomen). Chills. Feeling weak. Feeling like you may vomit (nauseous). Muscle aches. Not feeling hungry. How is this treated? This condition typically goes away on its own. The focus of treatment is to replace the fluids that you lose. This condition may be treated with: An ORS (oral rehydration solution). This is a drink that helps you replace fluids and minerals your body lost. It is sold at pharmacies and stores. Medicines to help with your symptoms. Probiotic supplements to reduce symptoms of watery poop. Fluids given through an IV tube, if needed. Older adults and people with other diseases or a weak body defense system are at higher risk for not having enough  water in the body. Follow these instructions at home: Eating and drinking  Take an ORS as told by your doctor. Drink clear fluids in small amounts as you are able. Clear fluids include: Water. Ice chips. Fruit juice that has water added to it (is diluted). Low-calorie sports  drinks. Drink enough fluid to keep your pee (urine) pale yellow. Eat small amounts of healthy foods every 3-4 hours as you are able. This may include whole grains, fruits, vegetables, lean meats, and yogurt. Avoid fluids that have a lot of sugar or caffeine in them. This includes energy drinks, sports drinks, and soda. Avoid spicy or fatty foods. Avoid alcohol. General instructions  Wash your hands often. This is very important after you have watery poop or you vomit. If you cannot use soap and water, use hand sanitizer. Make sure that all people in your home wash their hands well and often. Take over-the-counter and prescription medicines only as told by your doctor. Rest at home while you get better. Watch your condition for any changes. Take a warm bath to help with any burning or pain from having watery poop. Keep all follow-up visits. Contact a doctor if: You cannot keep fluids down. Your symptoms get worse. You have new symptoms. You feel light-headed or dizzy. You have muscle cramps. Get help right away if: You have chest pain. You have trouble breathing, or you are breathing very fast. You have a fast heartbeat. You feel very weak or you faint. You have a very bad headache, a stiff neck, or both. You have a rash. You have very bad pain, cramping, or bloating in your belly. Your skin feels cold and clammy. You feel mixed up (confused). You have pain when you pee. You have signs of not having enough water in the body, such as: Dark pee, hardly any pee, or no pee. Cracked lips. Dry mouth. Sunken eyes. Feeling very sleepy. Feeling weak. You have signs of bleeding, such as: You see blood in your vomit. Your vomit looks like coffee grounds. You have bloody or black poop or poop that looks like tar. These symptoms may be an emergency. Get help right away. Call 911. Do not wait to see if the symptoms will go away. Do not drive yourself to the hospital. Summary Viral  gastroenteritis is also known as the stomach flu. This condition can cause sudden watery poop (diarrhea), fever, and vomiting. These germs can be passed from person to person very easily. Take an ORS (oral rehydration solution) as told by your doctor. This is a drink that is sold at pharmacies and stores. Wash your hands often, especially after having watery poop or vomiting. If you cannot use soap and water, use hand sanitizer. This information is not intended to replace advice given to you by your health care provider. Make sure you discuss any questions you have with your health care provider. Document Revised: 08/09/2021 Document Reviewed: 08/09/2021 Elsevier Patient Education  2024 [employer]   If you have been instructed to have an in-person evaluation today at a local Urgent Care facility, please use the link below. It will take you to a list of all of our available [facility], including address, phone number and hours of operation. Please do not delay care.  [facility]  If you or a family member do not have a primary care provider, use the link below to schedule a visit and establish care. When you choose a [facility] primary care  physician or advanced practice provider, you gain a long-term partner in health. Find a Primary Care Provider  Learn more about [facility] in-office and virtual care options: [facility] Get Care Now  "

## 2024-10-13 NOTE — Progress Notes (Signed)
 " Virtual Visit Consent   Samuel Fields, you are scheduled for a virtual visit with a [facility] provider today. Just as with appointments in the office, your consent must be obtained to participate. Your consent will be active for this visit and any virtual visit you may have with one of our providers in the next 365 days. If you have a MyChart account, a copy of this consent can be sent to you electronically.  As this is a virtual visit, video technology does not allow for your provider to perform a traditional examination. This may limit your provider's ability to fully assess your condition. If your provider identifies any concerns that need to be evaluated in person or the need to arrange testing (such as labs, EKG, etc.), we will make arrangements to do so. Although advances in technology are sophisticated, we cannot ensure that it will always work on either your end or our end. If the connection with a video visit is poor, the visit may have to be switched to a telephone visit. With either a video or telephone visit, we are not always able to ensure that we have a secure connection.  By engaging in this virtual visit, you consent to the provision of healthcare and authorize for your insurance to be billed (if applicable) for the services provided during this visit. Depending on your insurance coverage, you may receive a charge related to this service.  I need to obtain your verbal consent now. Are you willing to proceed with your visit today? Samuel Fields has provided verbal consent on 10/13/2024 for a virtual visit (video or telephone). Samuel Fields, [place]  Date: 10/13/2024 3:54 PM   Virtual Visit via Video Note   I, Samuel Fields, connected with  DANTE ROUDEBUSH  ([id number], Jul 01, 1996) on 10/13/2024 at  3:45 PM EST by a video-enabled telemedicine application and verified that I am speaking with the correct person using two identifiers.  Location: Patient: Virtual Visit  Location Patient: Home Provider: Virtual Visit Location Provider: Home Office   I discussed the limitations of evaluation and management by telemedicine and the availability of in person appointments. The patient expressed understanding and agreed to proceed.    History of Present Illness: Samuel Fields is a 28 y.o. who identifies as a male who was assigned male at birth, and is being seen today for nausea/vomiting starting last night into this morning. Notes 2 episodes of vomiting early this morning. Still with substantial nausea. Denies diarrhea or focal abdominal pain. Some mild abdominal tenderness. Notes low-grade fever this morning. Denies recent travel or known sick contact. Denies abnormal food/water source.   HPI: HPI  Problems:  Patient Active Problem List   Diagnosis Date Noted   Preventative health care 12/07/2021   Family history of early CAD 12/07/2021   Acute foot pain, right 11/30/2021   Chronic pain in left foot 10/29/2018   Hyperlipidemia 07/05/2017   Elevated blood pressure reading 07/05/2017   Skin tag 07/05/2017    Allergies: Allergies[1] Medications: Current Medications[2]  Observations/Objective: Patient is well-developed, well-nourished in no acute distress.  Resting comfortably  at home.  Head is normocephalic, atraumatic.  No labored breathing.  Speech is clear and coherent with logical content.  Patient is alert and oriented at baseline.   Assessment and Plan: 1. Gastroenteritis (Primary) - ondansetron  (ZOFRAN -ODT) 4 MG disintegrating tablet; Take 1 tablet (4 mg total) by mouth every 8 (eight) hours as needed for nausea or vomiting.  Dispense: 20 tablet; Refill: 0  Mild food poisoning versus start og GI bug. No alarm signs or symptoms present. Supportive measures and OTC medications reviewed. Zofran  per orders. Start [employer] Work note provided. Strict in-person evaluation precautions reviewed with patient.   Follow Up Instructions: I discussed the  assessment and treatment plan with the patient. The patient was provided an opportunity to ask questions and all were answered. The patient agreed with the plan and demonstrated an understanding of the instructions.  A copy of instructions were sent to the patient via MyChart unless otherwise noted below.   The patient was advised to call back or seek an in-person evaluation if the symptoms worsen or if the condition fails to improve as anticipated.    Samuel Velma Lunger, PA-C    [1] No Known Allergies [2]  Current Outpatient Medications:    ondansetron  (ZOFRAN -ODT) 4 MG disintegrating tablet, Take 1 tablet (4 mg total) by mouth every 8 (eight) hours as needed for nausea or vomiting., Disp: 20 tablet, Rfl: 0  "
